# Patient Record
Sex: Female | Born: 1935 | Race: White | Hispanic: No | Marital: Single | Smoking: Never smoker
Health system: Southern US, Community
[De-identification: ages and names within clinical notes are randomized; demographics above are authoritative.]

## PROBLEM LIST (undated history)

## (undated) DIAGNOSIS — I639 Cerebral infarction, unspecified: Secondary | ICD-10-CM

## (undated) DIAGNOSIS — F32A Depression, unspecified: Secondary | ICD-10-CM

## (undated) DIAGNOSIS — I10 Essential (primary) hypertension: Secondary | ICD-10-CM

## (undated) DIAGNOSIS — F329 Major depressive disorder, single episode, unspecified: Secondary | ICD-10-CM

## (undated) DIAGNOSIS — I4891 Unspecified atrial fibrillation: Secondary | ICD-10-CM

## (undated) HISTORY — DX: Unspecified atrial fibrillation: I48.91

## (undated) HISTORY — PX: CHOLECYSTECTOMY: SHX55

## (undated) HISTORY — PX: JOINT REPLACEMENT: SHX530

## (undated) HISTORY — PX: BACK SURGERY: SHX140

---

## 2013-03-14 ENCOUNTER — Ambulatory Visit: Payer: Medicare Other | Attending: Orthopaedic Surgery | Admitting: Occupational Therapy

## 2013-03-14 DIAGNOSIS — M25649 Stiffness of unspecified hand, not elsewhere classified: Secondary | ICD-10-CM | POA: Insufficient documentation

## 2013-03-14 DIAGNOSIS — IMO0001 Reserved for inherently not codable concepts without codable children: Secondary | ICD-10-CM | POA: Insufficient documentation

## 2013-03-14 DIAGNOSIS — M6281 Muscle weakness (generalized): Secondary | ICD-10-CM | POA: Insufficient documentation

## 2013-03-15 ENCOUNTER — Ambulatory Visit: Payer: Medicare Other | Admitting: Occupational Therapy

## 2013-03-21 ENCOUNTER — Ambulatory Visit: Payer: Medicare Other | Admitting: Occupational Therapy

## 2013-03-30 ENCOUNTER — Ambulatory Visit: Payer: Medicare Other | Attending: Orthopaedic Surgery | Admitting: Occupational Therapy

## 2013-03-30 DIAGNOSIS — M25649 Stiffness of unspecified hand, not elsewhere classified: Secondary | ICD-10-CM | POA: Insufficient documentation

## 2013-03-30 DIAGNOSIS — IMO0001 Reserved for inherently not codable concepts without codable children: Secondary | ICD-10-CM | POA: Insufficient documentation

## 2013-03-30 DIAGNOSIS — M6281 Muscle weakness (generalized): Secondary | ICD-10-CM | POA: Insufficient documentation

## 2013-04-01 ENCOUNTER — Ambulatory Visit: Payer: Medicare Other | Admitting: Occupational Therapy

## 2013-04-06 ENCOUNTER — Ambulatory Visit: Payer: Medicare Other | Admitting: Occupational Therapy

## 2013-04-08 ENCOUNTER — Ambulatory Visit: Payer: Medicare Other | Admitting: Occupational Therapy

## 2013-04-13 ENCOUNTER — Ambulatory Visit: Payer: Medicare Other | Admitting: Occupational Therapy

## 2013-04-15 ENCOUNTER — Ambulatory Visit: Payer: Medicare Other | Admitting: Occupational Therapy

## 2013-04-18 ENCOUNTER — Ambulatory Visit: Payer: Medicare Other | Admitting: Occupational Therapy

## 2013-04-21 ENCOUNTER — Ambulatory Visit: Payer: Medicare Other | Admitting: Physical Therapy

## 2013-04-21 ENCOUNTER — Ambulatory Visit: Payer: Medicare Other | Admitting: Occupational Therapy

## 2013-04-25 ENCOUNTER — Ambulatory Visit: Payer: Medicare Other | Admitting: Rehabilitative and Restorative Service Providers"

## 2013-04-25 ENCOUNTER — Ambulatory Visit: Payer: Medicare Other | Attending: Orthopaedic Surgery | Admitting: Occupational Therapy

## 2013-04-25 DIAGNOSIS — M6281 Muscle weakness (generalized): Secondary | ICD-10-CM | POA: Insufficient documentation

## 2013-04-25 DIAGNOSIS — IMO0001 Reserved for inherently not codable concepts without codable children: Secondary | ICD-10-CM | POA: Insufficient documentation

## 2013-04-25 DIAGNOSIS — M25649 Stiffness of unspecified hand, not elsewhere classified: Secondary | ICD-10-CM | POA: Insufficient documentation

## 2013-04-27 ENCOUNTER — Ambulatory Visit: Payer: Medicare Other | Admitting: Occupational Therapy

## 2013-04-27 ENCOUNTER — Ambulatory Visit: Payer: Medicare Other | Admitting: Physical Therapy

## 2013-05-02 ENCOUNTER — Encounter: Payer: Medicare Other | Admitting: Occupational Therapy

## 2013-05-02 ENCOUNTER — Ambulatory Visit: Payer: Medicare Other | Admitting: Physical Therapy

## 2013-05-04 ENCOUNTER — Encounter: Payer: Medicare Other | Admitting: Occupational Therapy

## 2013-05-04 ENCOUNTER — Ambulatory Visit: Payer: Medicare Other | Admitting: Physical Therapy

## 2013-05-10 ENCOUNTER — Encounter: Payer: Medicare Other | Admitting: Occupational Therapy

## 2013-05-10 ENCOUNTER — Ambulatory Visit: Payer: Medicare Other | Admitting: Physical Therapy

## 2013-05-12 ENCOUNTER — Encounter: Payer: Medicare Other | Admitting: Occupational Therapy

## 2013-05-12 ENCOUNTER — Ambulatory Visit: Payer: Medicare Other | Admitting: Physical Therapy

## 2013-05-13 ENCOUNTER — Ambulatory Visit: Payer: Medicare Other | Admitting: Physical Therapy

## 2013-05-13 ENCOUNTER — Encounter: Payer: Medicare Other | Admitting: Occupational Therapy

## 2013-05-17 ENCOUNTER — Ambulatory Visit: Payer: Medicare Other | Admitting: Rehabilitative and Restorative Service Providers"

## 2013-05-19 ENCOUNTER — Ambulatory Visit: Payer: Medicare Other | Admitting: Physical Therapy

## 2013-05-20 ENCOUNTER — Ambulatory Visit: Payer: Medicare Other | Admitting: Physical Therapy

## 2016-02-22 MED FILL — TEMAZEPAM 15 MG CAPSULE: 15 | 30 days supply | Qty: 30 | Fill #0

## 2016-03-06 ENCOUNTER — Encounter: Payer: Self-pay | Admitting: Emergency Medicine

## 2016-03-06 ENCOUNTER — Emergency Department (INDEPENDENT_AMBULATORY_CARE_PROVIDER_SITE_OTHER)
Admission: EM | Admit: 2016-03-06 | Discharge: 2016-03-06 | Disposition: A | Discharge status: Home / Self Care | Payer: Medicare Other | Source: Home / Self Care | Attending: Emergency Medicine | Admitting: Emergency Medicine

## 2016-03-06 ENCOUNTER — Emergency Department (INDEPENDENT_AMBULATORY_CARE_PROVIDER_SITE_OTHER): Payer: Medicare Other

## 2016-03-06 DIAGNOSIS — J209 Acute bronchitis, unspecified: Secondary | ICD-10-CM

## 2016-03-06 DIAGNOSIS — R062 Wheezing: Secondary | ICD-10-CM

## 2016-03-06 DIAGNOSIS — R0989 Other specified symptoms and signs involving the circulatory and respiratory systems: Secondary | ICD-10-CM | POA: Diagnosis not present

## 2016-03-06 DIAGNOSIS — R059 Cough, unspecified: Secondary | ICD-10-CM

## 2016-03-06 DIAGNOSIS — R05 Cough: Secondary | ICD-10-CM

## 2016-03-06 DIAGNOSIS — J0100 Acute maxillary sinusitis, unspecified: Secondary | ICD-10-CM

## 2016-03-06 HISTORY — DX: Cerebral infarction, unspecified: I63.9

## 2016-03-06 HISTORY — DX: Major depressive disorder, single episode, unspecified: F32.9

## 2016-03-06 HISTORY — DX: Essential (primary) hypertension: I10

## 2016-03-06 HISTORY — DX: Depression, unspecified: F32.A

## 2016-03-06 MED ORDER — IPRATROPIUM-ALBUTEROL 0.5-2.5 (3) MG/3ML IN SOLN
1.5000 mL | RESPIRATORY_TRACT | Status: AC
  Administered 2016-03-06: 1.5 mL via RESPIRATORY_TRACT

## 2016-03-06 MED ORDER — BUDESONIDE-FORMOTEROL FUMARATE 80-4.5 MCG/ACT IN AERO: 2.0000 | INHALATION_SPRAY | Freq: Two times a day (BID) | RESPIRATORY_TRACT | 0 refills | Status: DC

## 2016-03-06 MED ORDER — FLUTICASONE PROPIONATE 50 MCG/ACT NA SUSP: NASAL | 0 refills | Status: DC

## 2016-03-06 MED ORDER — CEFDINIR 300 MG PO CAPS: 300.0000 mg | ORAL_CAPSULE | Freq: Two times a day (BID) | ORAL | 0 refills | Status: DC

## 2016-03-06 MED FILL — CEFDINIR 300 MG CAPSULE: 300 | 10 days supply | Qty: 20 | Fill #0

## 2016-03-06 MED FILL — FLUTICASONE PROP 50 MCG SPR: 50 | 30 days supply | Qty: 16 | Fill #0

## 2016-03-06 MED FILL — SYMBICORT 80-4.5 MCG INH: 80-4.5 | 30 days supply | Qty: 10 | Fill #0

## 2016-03-06 NOTE — ED Provider Notes (Signed)
[facility]    CSN: [id number] Arrival date & time: 03/06/16  1025     History   Chief Complaint Chief Complaint  Patient presents with  . Cough    HPI Megan Livingston is a 82 y.o. female.   The history is provided by the patient and a relative (Here with son-in-law.).  Pt is from [place], visiting and staying with daughter and son-in-law.  Was in usual state of health until onset of URI sxs 1 wk ago. Started with sinus congestion, discolored rhinorrhea, facial pain , progressed to bilat pressure in ears, hoarseness, sore throat. Then, over past 3 days, sxs progressed to frequent nonproductive cough, chest congestion, occ mild shortness of breath. Rest and otc tx is not helping. Denies increase in baseline mild pedal edema bilaterally. She denies hx or dx of asthma or COPD, but son-in-law states that in the past, had similar sxs, treated effectively with an abx and Symbicort. Denies hx of CHF.  No chills/sweats Possible Low-grade Fever  +  Nasal congestion +  Discolored Post-nasal drainage + sinus pain/pressure + sore throat  +  cough ? wheezing + chest congestion No hemoptysis No significant shortness of breath currently. Denies orthopnea. No pleuritic pain. No exertional chest pain.  No itchy/red eyes + bilat earache  No nausea No vomiting No abdominal pain No diarrhea  No skin rashes +  Fatigue No myalgias No headache Denies syncope. Denies any recent falls. Denies any new focal neuro symptoms.  Denies any GU sxs.  Past Medical History:  Diagnosis Date  . Depression   . Hypertension   . Stroke [facility](HCC)   History of "minor stroke" four years ago, hypertension, depression,Hypercholesterolemia. PCP is Dr. Debbora PrestoFlippo in [place]. Denies PMH of pneumonia, COPD, asthma, CHF. Her baseline ambulation is using a walker with assistance.   Past Surgical History:  Procedure Laterality Date  . BACK SURGERY    . CHOLECYSTECTOMY    . JOINT  REPLACEMENT    History of knee replacement 12 years ago, gallbladder surgery 10 years ago, back surgery four years ago.  OB History    No data available     Family history:Positive for heart disease, CHF, CVA, depression  Home Medications    Prior to Admission medications   Medication Sig Start Date End Date Taking? Authorizing Provider  aspirin 81 MG chewable tablet Chew by mouth daily.   Yes Historical Provider, MD  DULoxetine (CYMBALTA) 20 MG capsule Take 20 mg by mouth daily.   Yes Historical Provider, MD  losartan (COZAAR) 100 MG tablet Take 100 mg by mouth daily.   Yes Historical Provider, MD  metoprolol (LOPRESSOR) 100 MG tablet Take 100 mg by mouth 2 (two) times daily.   Yes Historical Provider, MD  omeprazole (PRILOSEC) 10 MG capsule Take 10 mg by mouth daily.   Yes Historical Provider, MD  simvastatin (ZOCOR) 10 MG tablet Take 10 mg by mouth daily.   Yes Historical Provider, MD  temazepam (RESTORIL) 15 MG capsule Take 15 mg by mouth at bedtime as needed for sleep.   Yes Historical Provider, MD  budesonide-formoterol (SYMBICORT) 80-4.5 MCG/ACT inhaler Inhale 2 puffs into the lungs 2 (two) times daily. 03/06/16   Lajean Manesavid Massey, MD  cefdinir (OMNICEF) 300 MG capsule Take 1 capsule (300 mg total) by mouth 2 (two) times daily. X 10 days 03/06/16   Lajean Manesavid Massey, MD  fluticasone [facility](FLONASE) 50 MCG/ACT nasal spray 1 or 2 sprays each nostril twice a day 03/06/16  Lajean Manesavid Massey, MD    Family History No family history on file.  Social History Social History  Substance Use Topics  . Smoking status: Never Smoker  . Smokeless tobacco: Never Used  . Alcohol use No     Allergies   Patient has no known allergies.   Review of Systems Review of Systems  All other systems reviewed and are negative.    Physical Exam Triage Vital Signs ED Triage Vitals  Enc Vitals Group     BP 03/06/16 1052 137/91     Pulse Rate 03/06/16 1052 95     Resp --      Temp 03/06/16 1052 98.4 F (36.9  C)     Temp Source 03/06/16 1052 Oral     SpO2 03/06/16 1052 94 %     Weight 03/06/16 1053 220 lb (99.8 kg)     Height 03/06/16 1053 5\' 3"  (1.6 m)     Head Circumference --      Peak Flow --      Pain Score 03/06/16 1058 4     Pain Loc --      Pain Edu? --      Excl. in GC? --    No data found.   Updated Vital Signs BP 137/91 (BP Location: Left Arm)   Pulse 93   Temp 98.4 F (36.9 C) (Oral)   Ht 5\' 3"  (1.6 m)   Wt 220 lb (99.8 kg)   SpO2 95%   BMI 38.97 kg/m    Physical Exam  Constitutional: She is oriented to person, place, and time. She appears well-developed and well-nourished. No distress.  Pleasant female. Cooperative. Uses walker to ambulate.  HENT:  Head: Normocephalic and atraumatic.  Right Ear: Tympanic membrane, external ear and ear canal normal.  Left Ear: Tympanic membrane, external ear and ear canal normal.  Nose: Mucosal edema and rhinorrhea present. Right sinus exhibits maxillary sinus tenderness. Left sinus exhibits maxillary sinus tenderness.  Mouth/Throat: No oral lesions.  Oral mucous membranes moist. Post pharynx minimal injection. Mild yellow seromucoid exudate in posterior pharynx. No tonsilar enlargement. No pharyngeal edema. Airway intact. Voice is hoarse.  Eyes: Right eye exhibits no discharge. Left eye exhibits no discharge. No scleral icterus.  Neck: Neck supple. No JVD present. No tracheal deviation present.  Cardiovascular: Normal rate, regular rhythm and normal heart sounds.   Pulmonary/Chest: Effort normal. No stridor. She has wheezes. She has rhonchi. She has no rales.  Diffuse harsh rhonchi in all lung fields, posterior and anterior bilaterally. + mild late expiratory wheezes bilat. Fairly good air movement bilat. Breath sounds equal bilat.  Musculoskeletal: She exhibits edema (trace pretibial edema bilat). She exhibits no tenderness (No calf ttp or cords.).  Lymphadenopathy:    She has no cervical adenopathy.  Neurological: She is  alert and oriented to person, place, and time.  Skin: Skin is warm and dry. Capillary refill takes less than 2 seconds. No rash noted.  Psychiatric: She has a normal mood and affect.  Nursing note and vitals reviewed.    UC Treatments / Results  Labs (all labs ordered are listed, but only abnormal results are displayed) Labs Reviewed - No data to display  EKG  EKG Interpretation None       Radiology Dg Chest 2 View  Result Date: 03/06/2016 CLINICAL DATA:  Cough and congestion for 1 week EXAM: CHEST  2 VIEW COMPARISON:  None. FINDINGS: Cardiac shadow is within normal limits. Hiatal hernia is noted. Lungs are  well aerated bilaterally. Mild interstitial changes are seen without focal infiltrate. No effusion is noted. No bony abnormality seen. IMPRESSION: Mild interstitial changes which may be chronic in nature. Electronically Signed   By: Alcide Clever M.D.   On: 03/06/2016 11:54    Procedures Procedures (including critical care time)  Medications Ordered in UC Medications  ipratropium-albuterol (DUONEB) 0.5-2.5 (3) MG/3ML nebulizer solution 1.5 mL (1.5 mLs Nebulization Given 03/06/16 1137)  ipratropium-albuterol (DUONEB) 0.5-2.5 (3) MG/3ML nebulizer solution 1.5 mL (1.5 mLs Nebulization Given 03/06/16 1158)     Initial Impression / Assessment and Plan / UC Course  I have reviewed the triage vital signs and the nursing notes.  Pertinent labs & imaging results that were available during my care of the patient were reviewed by me and considered in my medical decision making (see chart for details).  Clinical Course as of Mar 06 2337  Thu Mar 06, 2016  1118 Here with son-in-law.History and physical exam performed. Ordered a DuoNeb and chest x-ray  [DM]  1143 After first DuoNeb 1.5 ML, patient tolerated well without side effects. Vital signs remain stable. Wheezing improved somewhat. Pulse ox rechecked 94% room air. We will give another 1.5 ML DuoNeb treatment in attempt to  improve wheezing further.  [DM]    Clinical Course User Index [DM] Lajean Manes, MD  note: Because of age, ordered lower first dose of 1.5 ML's DuoNeb as a precautionary measure. She tolerated this well. - see above. Then, ordered another Duoneb 1.5 ml.-She tolerated this well and she felt breathing improved. Lungs reck'd: Much improved aeration bilat. Much less wheezing. Pulse ox, RA improved to 95%. P 84 by me, RR 16.   Reviewed CXR: No infiltrates. NAD. No definite CHF.  Final Clinical Impressions(s) / UC Diagnoses   Final diagnoses:  Wheezing  Cough  Chest congestion  Acute bronchitis with bronchospasm  Acute maxillary sinusitis, recurrence not specified   Treatment options discussed, as well as risks, benefits, alternatives. They declined oral steroid burst or shot of DepoMedrol. They declined shot of Rocephin. Patient and son-in-law voiced understanding and agreement with the following plans:   New Prescriptions Discharge Medication List as of 03/06/2016 12:34 PM    START taking these medications   Details  budesonide-formoterol (SYMBICORT) 80-4.5 MCG/ACT inhaler Inhale 2 puffs into the lungs 2 (two) times daily., Starting Thu 03/06/2016, Normal    cefdinir (OMNICEF) 300 MG capsule Take 1 capsule (300 mg total) by mouth 2 (two) times daily. X 10 days, Starting Thu 03/06/2016, Normal    fluticasone (FLONASE) 50 MCG/ACT nasal spray 1 or 2 sprays each nostril twice a day, Normal      I'm choosing Omnicef for bacteria coverage of sinus and bronchitis pathogens. Follow-up with your doctor in [place] or urgent care ([facility]) if not improving in 4-5 days, or ER if worse or any red flags. Precautions discussed. Other otc sxs tx discussed. Red flags discussed. An After Visit Summary was printed and given to the patient. Questions invited and answered. They voiced understanding and agreement.    Lajean Manes, MD 03/06/16 [phone]

## 2016-03-06 NOTE — Discharge Instructions (Signed)
Chest x-ray today shows no pneumonia. Clinically, no evidence of congestive heart failure. Diagnosis is sinus infection and bronchitis with wheezing. Here in urgent care, we treated with DuoNeb nebulizer, which helped, and improved oxygen saturations to 95% May use Robitussin-DM as needed for cough. Tylenol as needed for headache. Drink plenty of fluids. Follow-up with your physician in [place]. If any worsening symptoms or if no better in one week, follow-up at urgent care or emergency department if needed.

## 2016-03-06 NOTE — ED Triage Notes (Signed)
Cough, headache, ears hurt, sore throat, hoarseness x 1 week, worse last 3 days, no fever

## 2016-03-18 ENCOUNTER — Emergency Department (INDEPENDENT_AMBULATORY_CARE_PROVIDER_SITE_OTHER): Payer: Medicare Other

## 2016-03-18 ENCOUNTER — Emergency Department (INDEPENDENT_AMBULATORY_CARE_PROVIDER_SITE_OTHER)
Admission: EM | Admit: 2016-03-18 | Discharge: 2016-03-18 | Disposition: A | Discharge status: Home / Self Care | Payer: Medicare Other | Source: Home / Self Care | Attending: Family Medicine | Admitting: Family Medicine

## 2016-03-18 ENCOUNTER — Encounter: Payer: Self-pay | Admitting: *Deleted

## 2016-03-18 DIAGNOSIS — J9801 Acute bronchospasm: Secondary | ICD-10-CM

## 2016-03-18 DIAGNOSIS — J069 Acute upper respiratory infection, unspecified: Secondary | ICD-10-CM

## 2016-03-18 DIAGNOSIS — J9811 Atelectasis: Secondary | ICD-10-CM

## 2016-03-18 DIAGNOSIS — R062 Wheezing: Secondary | ICD-10-CM

## 2016-03-18 MED ORDER — IPRATROPIUM-ALBUTEROL 0.5-2.5 (3) MG/3ML IN SOLN
3.0000 mL | Freq: Once | RESPIRATORY_TRACT | Status: AC
  Administered 2016-03-18: 3 mL via RESPIRATORY_TRACT

## 2016-03-18 MED ORDER — AEROCHAMBER PLUS W/MASK MISC: 2 refills | Status: DC

## 2016-03-18 MED ORDER — ALBUTEROL SULFATE HFA 108 (90 BASE) MCG/ACT IN AERS: 1.0000 | INHALATION_SPRAY | Freq: Four times a day (QID) | RESPIRATORY_TRACT | 0 refills | Status: DC | PRN

## 2016-03-18 MED ORDER — AZITHROMYCIN 250 MG PO TABS: 250.0000 mg | ORAL_TABLET | Freq: Every day | ORAL | 0 refills | Status: DC

## 2016-03-18 MED ORDER — METHYLPREDNISOLONE SODIUM SUCC 40 MG IJ SOLR
80.0000 mg | Freq: Once | INTRAMUSCULAR | Status: AC
  Administered 2016-03-18: 80 mg via INTRAMUSCULAR

## 2016-03-18 MED ORDER — PREDNISONE 20 MG PO TABS: ORAL_TABLET | ORAL | 0 refills | Status: DC

## 2016-03-18 MED FILL — predniSONE 20 MG TABS: 20 | 5 days supply | Qty: 11 | Fill #0

## 2016-03-18 MED FILL — AZITHROMYCIN 250 MG TABLET: 250 | 5 days supply | Qty: 6 | Fill #0

## 2016-03-18 MED FILL — PROAIR HFA 90 MCG INHALER: 108 (90 BAS | 25 days supply | Qty: 9 | Fill #0

## 2016-03-18 MED FILL — MICROCHAMBER: 1 days supply | Qty: 1 | Fill #0

## 2016-03-18 NOTE — ED Provider Notes (Signed)
CSN: [id number]     Arrival date & time 03/18/16  1232 History   First MD Initiated Contact with Patient 03/18/16 1316     Chief Complaint  Patient presents with  . Cough  . Shortness of Breath   (Consider location/radiation/quality/duration/timing/severity/associated sxs/prior Treatment) HPI Megan Livingston is a 82 y.o. female presenting to UC with son-in-law c/o continued moderately productive cough, shortness of breath and fatigue despite completion of Omnicef last week.  [facility] for worsening URI symptoms.  Pt declined steroid treatment at that time. Denies hx of CHF, asthma or COPD.  Pt does use a walker for assistance ambulating but not for breathing and she is not on home O2. Pt is visiting from out of town. Denies fever, n/v/d.    Past Medical History:  Diagnosis Date  . Depression   . Hypertension   . Stroke [facility](HCC)    Past Surgical History:  Procedure Laterality Date  . BACK SURGERY    . CHOLECYSTECTOMY    . JOINT REPLACEMENT     History reviewed. No pertinent family history. Social History  Substance Use Topics  . Smoking status: Never Smoker  . Smokeless tobacco: Never Used  . Alcohol use No   OB History    No data available     Review of Systems  Constitutional: Positive for fatigue. Negative for chills and fever.  HENT: Positive for congestion. Negative for ear pain, sore throat, trouble swallowing and voice change.   Respiratory: Positive for cough, chest tightness, shortness of breath and wheezing.   Cardiovascular: Negative for chest pain and palpitations.  Gastrointestinal: Negative for abdominal pain, diarrhea, nausea and vomiting.  Musculoskeletal: Negative for arthralgias, back pain and myalgias.  Skin: Negative for rash.  Neurological: Positive for weakness ( generalized). Negative for dizziness, light-headedness and headaches.  All other systems reviewed and are negative.   Allergies  Patient has no known  allergies.  Home Medications   Prior to Admission medications   Medication Sig Start Date End Date Taking? Authorizing Provider  albuterol (PROVENTIL HFA;VENTOLIN HFA) 108 (90 Base) MCG/ACT inhaler Inhale 1-2 puffs into the lungs every 6 (six) hours as needed for wheezing or shortness of breath. 03/18/16   Junius FinnerErin O'Malley, PA-C  aspirin 81 MG chewable tablet Chew by mouth daily.    Historical Provider, MD  azithromycin (ZITHROMAX) 250 MG tablet Take 1 tablet (250 mg total) by mouth daily. Take first 2 tablets together, then 1 every day until finished. 03/18/16   Junius FinnerErin O'Malley, PA-C  budesonide-formoterol (SYMBICORT) 80-4.5 MCG/ACT inhaler Inhale 2 puffs into the lungs 2 (two) times daily. 03/06/16   Lajean Manesavid Massey, MD  DULoxetine (CYMBALTA) 20 MG capsule Take 20 mg by mouth daily.    Historical Provider, MD  fluticasone Aleda Grana(FLONASE) 50 MCG/ACT nasal spray 1 or 2 sprays each nostril twice a day 03/06/16   Lajean Manesavid Massey, MD  losartan (COZAAR) 100 MG tablet Take 100 mg by mouth daily.    Historical Provider, MD  metoprolol (LOPRESSOR) 100 MG tablet Take 100 mg by mouth 2 (two) times daily.    Historical Provider, MD  omeprazole (PRILOSEC) 10 MG capsule Take 10 mg by mouth daily.    Historical Provider, MD  predniSONE (DELTASONE) 20 MG tablet 3 tabs po day one, then 2 po daily x 4 days 03/18/16   Junius FinnerErin O'Malley, PA-C  simvastatin (ZOCOR) 10 MG tablet Take 10 mg by mouth daily.    Historical Provider, MD  Spacer/Aero-Holding Deretha Emoryhambers (  AEROCHAMBER PLUS WITH MASK) inhaler Use as instructed 03/18/16   Junius FinnerErin O'Malley, PA-C  temazepam (RESTORIL) 15 MG capsule Take 15 mg by mouth at bedtime as needed for sleep.    Historical Provider, MD   Meds Ordered and Administered this Visit   Medications  ipratropium-albuterol (DUONEB) 0.5-2.5 (3) MG/3ML nebulizer solution 3 mL (3 mLs Nebulization Given 03/18/16 1328)  methylPREDNISolone sodium succinate (SOLU-MEDROL) 40 mg/mL injection 80 mg (80 mg Intramuscular Given  03/18/16 1429)  ipratropium-albuterol (DUONEB) 0.5-2.5 (3) MG/3ML nebulizer solution 3 mL (3 mLs Nebulization Given 03/18/16 1444)    BP 161/78 (BP Location: Left Arm)   Pulse 88   Temp 97.9 F (36.6 C) (Oral)   Resp 20   SpO2 95% Comment: post duo neb No data found.   Physical Exam  Constitutional: She appears well-developed and well-nourished. No distress.  Pt sitting in exam chair, NAD.  HENT:  Head: Normocephalic and atraumatic.  Right Ear: Tympanic membrane normal.  Left Ear: Tympanic membrane normal.  Nose: Nose normal.  Mouth/Throat: Uvula is midline, oropharynx is clear and moist and mucous membranes are normal.  Eyes: Conjunctivae are normal. No scleral icterus.  Neck: Normal range of motion. Neck supple.  Cardiovascular: Normal rate, regular rhythm and normal heart sounds.   Pulmonary/Chest: Effort normal. No respiratory distress. She has wheezes. She has rhonchi. She has rales. She exhibits no tenderness.  Diffuse wheeze and coarse breath sounds with intermittent productive cough. No respiratory distress.  Musculoskeletal: Normal range of motion.  Neurological: She is alert.  Skin: Skin is warm and dry. She is not diaphoretic.  Nursing note and vitals reviewed.   Urgent Care Course   Clinical Course     Procedures (including critical care time)  Labs Review Labs Reviewed - No data to display  Imaging Review Dg Chest 2 View  Addendum Date: 03/18/2016   ADDENDUM REPORT: 03/18/2016 15:27 ADDENDUM: Comparison made to prior chest x-ray 03/06/2016. Mild basilar interstitial prominence and right base subsegmental atelectasis and/or scarring unchanged from prior exam. Electronically Signed   By: Maisie Fushomas  Register   On: 03/18/2016 15:27   Result Date: 03/18/2016 CLINICAL DATA:  Cough and congestion . EXAM: CHEST  2 VIEW COMPARISON:  No recent prior . FINDINGS: Mediastinum and hilar structures normal. Heart size normal. Mild right base subsegmental atelectasis and or  infiltrate. No pleural effusion or pneumothorax. Sliding hiatal hernia noted. IMPRESSION: Mild right base subsegmental atelectasis. Sliding hiatal hernia noted. Electronically Signed: ByMaisie Fus: Thomas  Register On: 03/18/2016 14:06    MDM   1. Upper respiratory tract infection, unspecified type   2. Wheeze   3. Acute bronchospasm    Pt c/o persistent cough with fatigue. O2 Sat 93% on RA Coarse breath sounds throughout. CXR: unchanged from prior CXR on 03/06/16.  Pt given 2 duobnebs in UC with solumedrol 80mg  IM  Slight improvement of lung sounds. O2 Sat up to 95% on RA Pt feels comfortable being discharged home. Rx: Prednisone, albuterol inhaler with spacer and azithromycin for atypical bacteria coverage. F/u in 2 days for recheck of symptoms.     Junius Finnerrin O'Malley, PA-C 03/18/16 [phone]

## 2016-03-18 NOTE — Discharge Instructions (Signed)
°  You were given a shot of solumedrol (a steroid) today to help decrease inflammation in your airway to help with cough and wheeze.  You have been prescribed 5 days of prednisone, an oral steroid.  You may start this medication tomorrow with breakfast.

## 2016-03-18 NOTE — ED Triage Notes (Signed)
Patient was seen 03/06/2016, given Omnicef which she has completed. She has since developed worsening cough, with SOB and fatigue. Afebrile.

## 2016-03-20 ENCOUNTER — Encounter: Payer: Self-pay | Admitting: *Deleted

## 2016-03-20 ENCOUNTER — Emergency Department (INDEPENDENT_AMBULATORY_CARE_PROVIDER_SITE_OTHER)
Admission: EM | Admit: 2016-03-20 | Discharge: 2016-03-20 | Disposition: A | Discharge status: Home / Self Care | Payer: Medicare Other | Source: Home / Self Care | Attending: Emergency Medicine | Admitting: Emergency Medicine

## 2016-03-20 DIAGNOSIS — J209 Acute bronchitis, unspecified: Secondary | ICD-10-CM

## 2016-03-20 NOTE — ED Provider Notes (Signed)
[facility]    CSN: [id number] Arrival date & time: 03/20/16  1038     History   Chief Complaint Chief Complaint  Patient presents with  . Cough  . Follow-up    HPI Megan Livingston is a 82 y.o. female.   HPI Patient's son-in-law brings her in to urgent care for follow-up from the most recent visit here 03/18/16.  Both patient and son-in-law provide history. She is taking the Zithromax and prednisone as recently prescribed here 03/18/16. She is tolerating this well. Overall, she feels definite and significant improvement over the past 2 days. Much less shortness of breath and wheezing. Energy and mood is gradually improving. Her cough is now loose and wet and occasionally productive of yellow sputum. She feels when she coughs up sputum, she feels even better. Appetite is improved. She notes that she is more active and has been able to enjoy her family activities much more the past 2 days( with her daughter and son-in-law) .  Further past medical history updated verbally from son-in-law: Has had negative cardiac workup in the past including normal EKG and echocardiogram within the past year or so. Past Medical History:  Diagnosis Date  . Depression   . Hypertension   . Stroke [facility](HCC)     There are no active problems to display for this patient.   Past Surgical History:  Procedure Laterality Date  . BACK SURGERY    . CHOLECYSTECTOMY    . JOINT REPLACEMENT      OB History    No data available       Home Medications    Prior to Admission medications   Medication Sig Start Date End Date Taking? Authorizing Provider  albuterol (PROVENTIL HFA;VENTOLIN HFA) 108 (90 Base) MCG/ACT inhaler Inhale 1-2 puffs into the lungs every 6 (six) hours as needed for wheezing or shortness of breath. 03/18/16   Junius FinnerErin O'Malley, PA-C  aspirin 81 MG chewable tablet Chew by mouth daily.    Historical Provider, MD  azithromycin (ZITHROMAX) 250 MG tablet Take 1 tablet (250 mg  total) by mouth daily. Take first 2 tablets together, then 1 every day until finished. 03/18/16   Junius FinnerErin O'Malley, PA-C  budesonide-formoterol (SYMBICORT) 80-4.5 MCG/ACT inhaler Inhale 2 puffs into the lungs 2 (two) times daily. 03/06/16   Lajean Manesavid Massey, MD  DULoxetine (CYMBALTA) 20 MG capsule Take 20 mg by mouth daily.    Historical Provider, MD  fluticasone Aleda Grana(FLONASE) 50 MCG/ACT nasal spray 1 or 2 sprays each nostril twice a day 03/06/16   Lajean Manesavid Massey, MD  losartan (COZAAR) 100 MG tablet Take 100 mg by mouth daily.    Historical Provider, MD  metoprolol (LOPRESSOR) 100 MG tablet Take 100 mg by mouth 2 (two) times daily.    Historical Provider, MD  omeprazole (PRILOSEC) 10 MG capsule Take 10 mg by mouth daily.    Historical Provider, MD  predniSONE (DELTASONE) 20 MG tablet 3 tabs po day one, then 2 po daily x 4 days 03/18/16   Junius FinnerErin O'Malley, PA-C  simvastatin (ZOCOR) 10 MG tablet Take 10 mg by mouth daily.    Historical Provider, MD  Spacer/Aero-Holding Chambers (AEROCHAMBER PLUS WITH MASK) inhaler Use as instructed 03/18/16   Junius FinnerErin O'Malley, PA-C  temazepam (RESTORIL) 15 MG capsule Take 15 mg by mouth at bedtime as needed for sleep.    Historical Provider, MD    Family History History reviewed. No pertinent family history.  Social History Social History  Substance Use Topics  .  Smoking status: Never Smoker  . Smokeless tobacco: Never Used  . Alcohol use No     Allergies   Patient has no known allergies.   Review of Systems Review of Systems  All other systems reviewed and are negative.    Physical Exam Triage Vital Signs ED Triage Vitals  Enc Vitals Group     BP 03/20/16 1116 161/71     Pulse Rate 03/20/16 1116 93     Resp 03/20/16 1116 18     Temp 03/20/16 1116 98 F (36.7 C)     Temp Source 03/20/16 1116 Oral     SpO2 03/20/16 1116 91 %     Weight 03/20/16 1116 218 lb (98.9 kg)     Height --      Head Circumference --      Peak Flow --      Pain Score 03/20/16 1117 0      Pain Loc --      Pain Edu? --      Excl. in GC? --    No data found.   Updated Vital Signs BP 161/71 (BP Location: Left Arm)   Pulse 93   Temp 98 F (36.7 C) (Oral)   Resp 18   Wt 218 lb (98.9 kg)   SpO2 91%   BMI 38.62 kg/m    Physical Exam  Constitutional: She is oriented to person, place, and time. She appears well-developed and well-nourished. No distress.  Overall, she looks happier and more comfortable than last time I saw her. Smiling a lot more. Breathing comfortably.  HENT:  Head: Normocephalic and atraumatic.  Right Ear: Tympanic membrane normal.  Left Ear: Tympanic membrane normal.  Nose: Nose normal.  Mouth/Throat: Oropharynx is clear and moist. No oropharyngeal exudate.  Eyes: Right eye exhibits no discharge. Left eye exhibits no discharge. No scleral icterus.  Neck: Neck supple.  Cardiovascular: Normal rate, regular rhythm and normal heart sounds.   Pulmonary/Chest: No respiratory distress. She has wheezes (Rare scattered late expiratory wheezes, but otherwise lungs clear, good air movement.). She has no rhonchi. She has no rales.  Lungs with rare late expiratory wheezes but otherwise clear. No rhonchi or rales. Breath sounds equal.  Lymphadenopathy:    She has no cervical adenopathy.  Neurological: She is alert and oriented to person, place, and time.  Skin: Skin is warm and dry. No rash noted. She is not diaphoretic.  Nursing note and vitals reviewed.  Repeated respiratory rate 16. Repeated pulse ox 94% on room air.  UC Treatments / Results  Labs (all labs ordered are listed, but only abnormal results are displayed) Labs Reviewed - No data to display  EKG  EKG Interpretation None       Radiology No results found.  Procedures Procedures (including critical care time)  Medications Ordered in UC Medications - No data to display   Initial Impression / Assessment and Plan / UC Course  I have reviewed the triage vital signs and the  nursing notes.  Pertinent labs & imaging results that were available during my care of the patient were reviewed by me and considered in my medical decision making (see chart for details).  Clinical Course      Final Clinical Impressions(s) / UC Diagnoses   Final diagnoses:  Acute bronchitis, unspecified organism  Clinically, her acute bronchitis with bronchospasm is significantly improving. Discussed options at length with patient and son-in-law. They declined any IM treatment, such as the option of Rocephin or another  Depo-Medrol shot. As she is significantly improving, will continue her current new medications azithromycin, prednisone burst.  An After Visit Summary was printed and given to the patient and son-in-law. Son-in-law states that family is driving to [place] to see other family, en route to driving the patient back to her home in [place]. Follow-up with your primary care doctor in 3-5 days. Precautions discussed. Red flags discussed.-Emergency room if any red flag Questions invited and answered. They voiced understanding and agreement.     Lajean Manesavid Massey, MD 03/20/16 [phone]

## 2016-03-20 NOTE — Discharge Instructions (Signed)
You are gradually improving. Lungs sound better today. Continue medicines as previously prescribed, particularly azithromycin and prednisone, as well as albuterol inhaler if needed for wheezing. Follow-up with your personal physician in 3-5 days. If any severe or worsening symptoms, go to emergency room.

## 2016-03-20 NOTE — ED Triage Notes (Signed)
Patient is here for a f/u from visit on 03/18/2016. She feels minimal improvement in breathing but does have more energy and her cough is now loose/wet. . She is taking medications as prescribed.

## 2017-02-17 MED FILL — TEMAZEPAM 15 MG CAPSULE: 15 | 30 days supply | Qty: 30 | Fill #0

## 2018-03-01 MED FILL — TEMAZEPAM 15 MG CAPSULE: 15 | 30 days supply | Qty: 30 | Fill #0

## 2018-09-23 ENCOUNTER — Emergency Department (HOSPITAL_COMMUNITY): Payer: Medicare Other

## 2018-09-23 ENCOUNTER — Inpatient Hospital Stay (HOSPITAL_COMMUNITY): Payer: Medicare Other

## 2018-09-23 ENCOUNTER — Other Ambulatory Visit: Payer: Self-pay

## 2018-09-23 ENCOUNTER — Encounter (HOSPITAL_COMMUNITY): Payer: Self-pay | Admitting: Neurology

## 2018-09-23 ENCOUNTER — Inpatient Hospital Stay (HOSPITAL_COMMUNITY)
Admission: EM | Admit: 2018-09-23 | Discharge: 2018-09-25 | DRG: 069 | Disposition: A | Discharge status: Home Health | Payer: Medicare Other | Attending: Internal Medicine | Admitting: Internal Medicine

## 2018-09-23 DIAGNOSIS — F32A Depression, unspecified: Secondary | ICD-10-CM | POA: Clinically undetermined

## 2018-09-23 DIAGNOSIS — I951 Orthostatic hypotension: Secondary | ICD-10-CM | POA: Diagnosis not present

## 2018-09-23 DIAGNOSIS — I358 Other nonrheumatic aortic valve disorders: Secondary | ICD-10-CM | POA: Diagnosis present

## 2018-09-23 DIAGNOSIS — Z7952 Long term (current) use of systemic steroids: Secondary | ICD-10-CM | POA: Diagnosis not present

## 2018-09-23 DIAGNOSIS — R8290 Unspecified abnormal findings in urine: Secondary | ICD-10-CM | POA: Diagnosis present

## 2018-09-23 DIAGNOSIS — R29701 NIHSS score 1: Secondary | ICD-10-CM | POA: Diagnosis present

## 2018-09-23 DIAGNOSIS — I10 Essential (primary) hypertension: Secondary | ICD-10-CM | POA: Diagnosis present

## 2018-09-23 DIAGNOSIS — Z8673 Personal history of transient ischemic attack (TIA), and cerebral infarction without residual deficits: Secondary | ICD-10-CM | POA: Diagnosis not present

## 2018-09-23 DIAGNOSIS — Z885 Allergy status to narcotic agent status: Secondary | ICD-10-CM

## 2018-09-23 DIAGNOSIS — Z7951 Long term (current) use of inhaled steroids: Secondary | ICD-10-CM | POA: Diagnosis not present

## 2018-09-23 DIAGNOSIS — Z1159 Encounter for screening for other viral diseases: Secondary | ICD-10-CM

## 2018-09-23 DIAGNOSIS — G8321 Monoplegia of upper limb affecting right dominant side: Secondary | ICD-10-CM | POA: Diagnosis present

## 2018-09-23 DIAGNOSIS — I491 Atrial premature depolarization: Secondary | ICD-10-CM | POA: Diagnosis present

## 2018-09-23 DIAGNOSIS — I639 Cerebral infarction, unspecified: Secondary | ICD-10-CM | POA: Diagnosis present

## 2018-09-23 DIAGNOSIS — G459 Transient cerebral ischemic attack, unspecified: Secondary | ICD-10-CM | POA: Diagnosis not present

## 2018-09-23 DIAGNOSIS — R000 Tachycardia, unspecified: Secondary | ICD-10-CM | POA: Diagnosis present

## 2018-09-23 DIAGNOSIS — Z7982 Long term (current) use of aspirin: Secondary | ICD-10-CM | POA: Diagnosis not present

## 2018-09-23 DIAGNOSIS — F329 Major depressive disorder, single episode, unspecified: Secondary | ICD-10-CM | POA: Diagnosis present

## 2018-09-23 DIAGNOSIS — N179 Acute kidney failure, unspecified: Secondary | ICD-10-CM | POA: Diagnosis present

## 2018-09-23 DIAGNOSIS — R531 Weakness: Secondary | ICD-10-CM | POA: Diagnosis not present

## 2018-09-23 DIAGNOSIS — Z9049 Acquired absence of other specified parts of digestive tract: Secondary | ICD-10-CM | POA: Diagnosis not present

## 2018-09-23 DIAGNOSIS — N289 Disorder of kidney and ureter, unspecified: Secondary | ICD-10-CM | POA: Diagnosis not present

## 2018-09-23 DIAGNOSIS — M4800 Spinal stenosis, site unspecified: Secondary | ICD-10-CM | POA: Diagnosis present

## 2018-09-23 DIAGNOSIS — E669 Obesity, unspecified: Secondary | ICD-10-CM | POA: Diagnosis present

## 2018-09-23 DIAGNOSIS — R278 Other lack of coordination: Secondary | ICD-10-CM | POA: Diagnosis present

## 2018-09-23 DIAGNOSIS — Z8249 Family history of ischemic heart disease and other diseases of the circulatory system: Secondary | ICD-10-CM

## 2018-09-23 DIAGNOSIS — F458 Other somatoform disorders: Secondary | ICD-10-CM | POA: Diagnosis present

## 2018-09-23 DIAGNOSIS — H04123 Dry eye syndrome of bilateral lacrimal glands: Secondary | ICD-10-CM | POA: Diagnosis present

## 2018-09-23 DIAGNOSIS — F0390 Unspecified dementia without behavioral disturbance: Secondary | ICD-10-CM | POA: Diagnosis present

## 2018-09-23 DIAGNOSIS — Z6835 Body mass index (BMI) 35.0-35.9, adult: Secondary | ICD-10-CM | POA: Diagnosis not present

## 2018-09-23 DIAGNOSIS — K5900 Constipation, unspecified: Secondary | ICD-10-CM | POA: Diagnosis present

## 2018-09-23 DIAGNOSIS — E785 Hyperlipidemia, unspecified: Secondary | ICD-10-CM | POA: Diagnosis present

## 2018-09-23 DIAGNOSIS — Z79899 Other long term (current) drug therapy: Secondary | ICD-10-CM

## 2018-09-23 DIAGNOSIS — R682 Dry mouth, unspecified: Secondary | ICD-10-CM | POA: Diagnosis present

## 2018-09-23 DIAGNOSIS — K219 Gastro-esophageal reflux disease without esophagitis: Secondary | ICD-10-CM | POA: Diagnosis present

## 2018-09-23 DIAGNOSIS — R251 Tremor, unspecified: Secondary | ICD-10-CM | POA: Diagnosis present

## 2018-09-23 DIAGNOSIS — R296 Repeated falls: Secondary | ICD-10-CM | POA: Diagnosis present

## 2018-09-23 DIAGNOSIS — R29810 Facial weakness: Secondary | ICD-10-CM | POA: Diagnosis present

## 2018-09-23 LAB — URINALYSIS, ROUTINE W REFLEX MICROSCOPIC
Bilirubin Urine: NEGATIVE
Glucose, UA: NEGATIVE mg/dL
Hgb urine dipstick: NEGATIVE
Ketones, ur: NEGATIVE mg/dL
Nitrite: POSITIVE — AB
Protein, ur: NEGATIVE mg/dL
Specific Gravity, Urine: 1.01 (ref 1.005–1.030)
pH: 7 (ref 5.0–8.0)

## 2018-09-23 LAB — COMPREHENSIVE METABOLIC PANEL
ALT: 17 U/L (ref 0–44)
AST: 22 U/L (ref 15–41)
Albumin: 3.1 g/dL — ABNORMAL LOW (ref 3.5–5.0)
Alkaline Phosphatase: 99 U/L (ref 38–126)
Anion gap: 11 (ref 5–15)
BUN: 22 mg/dL (ref 8–23)
CO2: 26 mmol/L (ref 22–32)
Calcium: 9.3 mg/dL (ref 8.9–10.3)
Chloride: 104 mmol/L (ref 98–111)
Creatinine, Ser: 1.11 mg/dL — ABNORMAL HIGH (ref 0.44–1.00)
GFR calc Af Amer: 52 mL/min — ABNORMAL LOW (ref >60)
GFR calc non Af Amer: 45 mL/min — ABNORMAL LOW (ref >60)
Glucose, Bld: 125 mg/dL — ABNORMAL HIGH (ref 70–99)
Potassium: 4.6 mmol/L (ref 3.5–5.1)
Sodium: 141 mmol/L (ref 135–145)
Total Bilirubin: 0.6 mg/dL (ref 0.3–1.2)
Total Protein: 6.3 g/dL — ABNORMAL LOW (ref 6.5–8.1)

## 2018-09-23 LAB — RAPID URINE DRUG SCREEN, HOSP PERFORMED
Amphetamines: NOT DETECTED
Barbiturates: NOT DETECTED
Benzodiazepines: NOT DETECTED
Cocaine: NOT DETECTED
Opiates: NOT DETECTED
Tetrahydrocannabinol: NOT DETECTED

## 2018-09-23 LAB — I-STAT CHEM 8, ED
BUN: 25 mg/dL — ABNORMAL HIGH (ref 8–23)
Calcium, Ion: 1.14 mmol/L — ABNORMAL LOW (ref 1.15–1.40)
Chloride: 106 mmol/L (ref 98–111)
Creatinine, Ser: 1.1 mg/dL — ABNORMAL HIGH (ref 0.44–1.00)
Glucose, Bld: 123 mg/dL — ABNORMAL HIGH (ref 70–99)
HCT: 43 % (ref 36.0–46.0)
Hemoglobin: 14.6 g/dL (ref 12.0–15.0)
Potassium: 4.5 mmol/L (ref 3.5–5.1)
Sodium: 139 mmol/L (ref 135–145)
TCO2: 30 mmol/L (ref 22–32)

## 2018-09-23 LAB — DIFFERENTIAL
Abs Immature Granulocytes: 0.03 10*3/uL (ref 0.00–0.07)
Basophils Absolute: 0.1 10*3/uL (ref 0.0–0.1)
Basophils Relative: 1 %
Eosinophils Absolute: 0.2 10*3/uL (ref 0.0–0.5)
Eosinophils Relative: 2 %
Immature Granulocytes: 0 %
Lymphocytes Relative: 29 %
Lymphs Abs: 2.4 10*3/uL (ref 0.7–4.0)
Monocytes Absolute: 0.5 10*3/uL (ref 0.1–1.0)
Monocytes Relative: 7 %
Neutro Abs: 5 10*3/uL (ref 1.7–7.7)
Neutrophils Relative %: 61 %

## 2018-09-23 LAB — CBC
HCT: 45.8 % (ref 36.0–46.0)
Hemoglobin: 14.1 g/dL (ref 12.0–15.0)
MCH: 29.4 pg (ref 26.0–34.0)
MCHC: 30.8 g/dL (ref 30.0–36.0)
MCV: 95.4 fL (ref 80.0–100.0)
Platelets: 213 10*3/uL (ref 150–400)
RBC: 4.8 MIL/uL (ref 3.87–5.11)
RDW: 13.6 % (ref 11.5–15.5)
WBC: 8.2 10*3/uL (ref 4.0–10.5)
nRBC: 0 % (ref 0.0–0.2)

## 2018-09-23 LAB — ETHANOL: Alcohol, Ethyl (B): <10 mg/dL (ref <10)

## 2018-09-23 LAB — CBG MONITORING, ED: Glucose-Capillary: 127 mg/dL — ABNORMAL HIGH (ref 70–99)

## 2018-09-23 LAB — PROTIME-INR
INR: 0.9 (ref 0.8–1.2)
Prothrombin Time: 12.1 seconds (ref 11.4–15.2)

## 2018-09-23 LAB — APTT: aPTT: 25 seconds (ref 24–36)

## 2018-09-23 MED ORDER — DONEPEZIL HCL 10 MG PO TABS
10.0000 mg | ORAL_TABLET | Freq: Every day | ORAL | Status: DC
  Administered 2018-09-23 – 2018-09-24 (×2): 10 mg via ORAL
  Filled 2018-09-23 (×2): qty 1

## 2018-09-23 MED ORDER — ACETAMINOPHEN 650 MG RE SUPP: 650.0000 mg | RECTAL | Status: DC | PRN

## 2018-09-23 MED ORDER — FESOTERODINE FUMARATE ER 8 MG PO TB24
8.0000 mg | ORAL_TABLET | Freq: Every day | ORAL | Status: DC
  Administered 2018-09-24 – 2018-09-25 (×2): 8 mg via ORAL
  Filled 2018-09-23 (×2): qty 1

## 2018-09-23 MED ORDER — DOCUSATE SODIUM 100 MG PO CAPS: 100.0000 mg | ORAL_CAPSULE | ORAL | Status: DC | PRN

## 2018-09-23 MED ORDER — ACETAMINOPHEN 160 MG/5ML PO SOLN: 650.0000 mg | ORAL | Status: DC | PRN

## 2018-09-23 MED ORDER — PANTOPRAZOLE SODIUM 40 MG PO TBEC
40.0000 mg | DELAYED_RELEASE_TABLET | Freq: Every day | ORAL | Status: DC
  Administered 2018-09-24 – 2018-09-25 (×2): 40 mg via ORAL
  Filled 2018-09-23 (×2): qty 1

## 2018-09-23 MED ORDER — ASPIRIN 300 MG RE SUPP: 300.0000 mg | Freq: Every day | RECTAL | Status: DC

## 2018-09-23 MED ORDER — ACETAMINOPHEN 325 MG PO TABS
650.0000 mg | ORAL_TABLET | ORAL | Status: DC | PRN
  Administered 2018-09-24: 650 mg via ORAL
  Filled 2018-09-23: qty 2

## 2018-09-23 MED ORDER — DULOXETINE HCL 60 MG PO CPEP
60.0000 mg | ORAL_CAPSULE | Freq: Two times a day (BID) | ORAL | Status: DC
  Administered 2018-09-23 – 2018-09-25 (×4): 60 mg via ORAL
  Filled 2018-09-23 (×4): qty 1

## 2018-09-23 MED ORDER — SODIUM CHLORIDE 0.9 % IV SOLN
Freq: Once | INTRAVENOUS | Status: AC
  Administered 2018-09-23: 18:00:00 via INTRAVENOUS

## 2018-09-23 MED ORDER — HYDRALAZINE HCL 20 MG/ML IJ SOLN: 10.0000 mg | INTRAMUSCULAR | Status: DC | PRN

## 2018-09-23 MED ORDER — SENNOSIDES-DOCUSATE SODIUM 8.6-50 MG PO TABS: 1.0000 | ORAL_TABLET | Freq: Every evening | ORAL | Status: DC | PRN

## 2018-09-23 MED ORDER — ATORVASTATIN CALCIUM 80 MG PO TABS
80.0000 mg | ORAL_TABLET | Freq: Every day | ORAL | Status: DC
  Administered 2018-09-23: 80 mg via ORAL
  Filled 2018-09-23: qty 1

## 2018-09-23 MED ORDER — ENOXAPARIN SODIUM 40 MG/0.4ML ~~LOC~~ SOLN
40.0000 mg | SUBCUTANEOUS | Status: DC
  Administered 2018-09-23 – 2018-09-24 (×2): 40 mg via SUBCUTANEOUS
  Filled 2018-09-23 (×2): qty 0.4

## 2018-09-23 MED ORDER — ASPIRIN 325 MG PO TABS
325.0000 mg | ORAL_TABLET | Freq: Every day | ORAL | Status: DC
  Administered 2018-09-23: 325 mg via ORAL
  Filled 2018-09-23 (×2): qty 1

## 2018-09-23 MED ORDER — GADOBUTROL 1 MMOL/ML IV SOLN
8.0000 mL | Freq: Once | INTRAVENOUS | Status: AC | PRN
  Administered 2018-09-23: 20:00:00 8 mL via INTRAVENOUS

## 2018-09-23 MED ORDER — STROKE: EARLY STAGES OF RECOVERY BOOK
Freq: Once | Status: AC
  Administered 2018-09-23: 1
  Filled 2018-09-23: qty 1

## 2018-09-23 NOTE — ED Notes (Signed)
Admitting at patient bedside.

## 2018-09-23 NOTE — ED Notes (Signed)
IV team at bedside 

## 2018-09-23 NOTE — ED Triage Notes (Signed)
Pt BIB [facility]. Per EMS patient woke up this morning with some right sided weakness. Per EMS family reported that the patient had some difficulty using her right arm this morning and it was cold this morning. Pt also reporting her dizziness is worse than normal. Pt normally uses a walker at baseline. EMS reports patient last seen normal was at 2200 last evening. Pt reports she does still feel weak in her right arm.

## 2018-09-23 NOTE — ED Notes (Signed)
Patient transported to CT 

## 2018-09-23 NOTE — ED Notes (Signed)
ED TO INPATIENT HANDOFF REPORT  ED Nurse Name and Phone #:  Joni Reiningicole [phone]  S Name/Age/Gender Megan DessMary Helen Livingston 85 y.o. female Room/Bed: 019C/019C  Code Status   Code Status: Full Code  Home/SNF/Other Home Patient oriented to: self, place, time and situation Is this baseline? Yes   Triage Complete: Triage complete  Chief Complaint Dizziness  Triage Note Pt BIB [facility]. Per EMS patient woke up this morning with some right sided weakness. Per EMS family reported that the patient had some difficulty using her right arm this morning and it was cold this morning. Pt also reporting her dizziness is worse than normal. Pt normally uses a walker at baseline. EMS reports patient last seen normal was at 2200 last evening. Pt reports she does still feel weak in her right arm.    Allergies Allergies  Allergen Reactions  . Oxycodone Other (See Comments)    hypotension    Level of Care/Admitting Diagnosis ED Disposition    ED Disposition Condition Comment   Admit  [facility]ospital [facility] [[id number]]  Level of Care: Telemetry Medical [104]  Covid Evaluation: Asymptomatic Screening Protocol (No Symptoms)  Diagnosis: CVA (cerebral vascular accident) [facility](HCC) [[id number]][[id number]]  Admitting Physician: Clydie BraunSMITH, RONDELL A [[id number]]  Attending Physician: Clydie BraunSMITH, RONDELL A [[id number]]  Estimated length of stay: past midnight tomorrow  Certification:: I certify this patient will need inpatient services for at least 2 midnights  PT Class (Do Not Modify): Inpatient [101]  PT Acc Code (Do Not Modify): Private [1]       B Medical/Surgery History Past Medical History:  Diagnosis Date  . Depression   . Hypertension   . Stroke [facility](HCC)    Past Surgical History:  Procedure Laterality Date  . BACK SURGERY    . CHOLECYSTECTOMY    . JOINT REPLACEMENT       A IV Location/Drains/Wounds Patient Lines/Drains/Airways Status   Active Line/Drains/Airways    Name:   Placement date:   Placement  time:   Site:   Days:   Peripheral IV 09/23/18 Right;Anterior Forearm   09/23/18    1136    Forearm   less than 1          Intake/Output Last 24 hours No intake or output data in the 24 hours ending 09/23/18 1712  Labs/Imaging Results for orders placed or performed during the hospital encounter of 09/23/18 (from the past 48 hour(s))  Ethanol     Status: None   Collection Time: 09/23/18 10:24 AM  Result Value Ref Range   Alcohol, Ethyl (B) <10 <10 mg/dL    Comment: (NOTE) Lowest detectable limit for serum alcohol is 10 mg/dL. For medical purposes only. Performed at [facility], [address] [place], [place] [address]   Protime-INR     Status: None   Collection Time: 09/23/18 10:24 AM  Result Value Ref Range   Prothrombin Time 12.1 11.4 - 15.2 seconds   INR 0.9 0.8 - 1.2    Comment: (NOTE) INR goal varies based on device and disease states. Performed at [facility], [address] [place], [place] [address]   APTT     Status: None   Collection Time: 09/23/18 10:24 AM  Result Value Ref Range   aPTT 25 24 - 36 seconds    Comment: Performed at [facility], [address] [place], [place] [address]  CBC     Status: None   Collection Time: 09/23/18 10:24 AM  Result  Value Ref Range   WBC 8.2 4.0 - 10.5 K/uL   RBC 4.80 3.87 - 5.11 MIL/uL   Hemoglobin 14.1 12.0 - 15.0 g/dL   HCT 45.8 36.0 - 46.0 %   MCV 95.4 80.0 - 100.0 fL   MCH 29.4 26.0 - 34.0 pg   MCHC 30.8 30.0 - 36.0 g/dL   RDW 13.6 11.5 - 15.5 %   Platelets 213 150 - 400 K/uL    Comment: REPEATED TO VERIFY SPECIMEN CHECKED FOR CLOTS    nRBC 0.0 0.0 - 0.2 %    Comment: Performed at [facility], [place] [address] [place], [place] [address]  Differential     Status: None   Collection Time: 09/23/18 10:24 AM  Result Value Ref Range   Neutrophils Relative % 61 %   Neutro Abs 5.0 1.7 - 7.7 K/uL   Lymphocytes Relative 29 %   Lymphs Abs 2.4 0.7 - 4.0 K/uL   Monocytes Relative 7 %    Monocytes Absolute 0.5 0.1 - 1.0 K/uL   Eosinophils Relative 2 %   Eosinophils Absolute 0.2 0.0 - 0.5 K/uL   Basophils Relative 1 %   Basophils Absolute 0.1 0.0 - 0.1 K/uL   Immature Granulocytes 0 %   Abs Immature Granulocytes 0.03 0.00 - 0.07 K/uL    Comment: Performed at [facility], [place] [address], [place], [place] [address]  Comprehensive metabolic panel     Status: Abnormal   Collection Time: 09/23/18 10:24 AM  Result Value Ref Range   Sodium 141 135 - 145 mmol/L   Potassium 4.6 3.5 - 5.1 mmol/L   Chloride 104 98 - 111 mmol/L   CO2 26 22 - 32 mmol/L   Glucose, Bld 125 (H) 70 - 99 mg/dL   BUN 22 8 - 23 mg/dL   Creatinine, Ser 1.11 (H) 0.44 - 1.00 mg/dL   Calcium 9.3 8.9 - 10.3 mg/dL   Total Protein 6.3 (L) 6.5 - 8.1 g/dL   Albumin 3.1 (L) 3.5 - 5.0 g/dL   AST 22 15 - 41 U/L   ALT 17 0 - 44 U/L   Alkaline Phosphatase 99 38 - 126 U/L   Total Bilirubin 0.6 0.3 - 1.2 mg/dL   GFR calc non Af Amer 45 (L) >60 mL/min   GFR calc Af Amer 52 (L) >60 mL/min   Anion gap 11 5 - 15    Comment: Performed at [place] [address], [place], [place] [address]  CBG monitoring, ED     Status: Abnormal   Collection Time: 09/23/18 10:55 AM  Result Value Ref Range   Glucose-Capillary 127 (H) 70 - 99 mg/dL   Comment 1 Notify RN    Comment 2 Document in Chart   I-stat chem 8, ED     Status: Abnormal   Collection Time: 09/23/18 11:31 AM  Result Value Ref Range   Sodium 139 135 - 145 mmol/L   Potassium 4.5 3.5 - 5.1 mmol/L   Chloride 106 98 - 111 mmol/L   BUN 25 (H) 8 - 23 mg/dL   Creatinine, Ser 1.10 (H) 0.44 - 1.00 mg/dL   Glucose, Bld 123 (H) 70 - 99 mg/dL   Calcium, Ion 1.14 (L) 1.15 - 1.40 mmol/L   TCO2 30 22 - 32 mmol/L   Hemoglobin 14.6 12.0 - 15.0 g/dL   HCT 43.0 36.0 - 46.0 %  Urinalysis, Routine w reflex microscopic     Status: Abnormal  Collection Time: 09/23/18  2:20 PM  Result Value Ref Range   Color, Urine YELLOW YELLOW   APPearance HAZY (A) CLEAR    Specific Gravity, Urine 1.010 1.005 - 1.030   pH 7.0 5.0 - 8.0   Glucose, UA NEGATIVE NEGATIVE mg/dL   Hgb urine dipstick NEGATIVE NEGATIVE   Bilirubin Urine NEGATIVE NEGATIVE   Ketones, ur NEGATIVE NEGATIVE mg/dL   Protein, ur NEGATIVE NEGATIVE mg/dL   Nitrite POSITIVE (A) NEGATIVE   Leukocytes,Ua SMALL (A) NEGATIVE   RBC / HPF 0-5 0 - 5 RBC/hpf   WBC, UA 6-10 0 - 5 WBC/hpf   Bacteria, UA MANY (A) NONE SEEN   Squamous Epithelial / LPF 0-5 0 - 5   Mucus PRESENT     Comment: Performed at [facility], [address] [place], [place] [address]  Urine rapid drug screen (hosp performed)     Status: None   Collection Time: 09/23/18  2:30 PM  Result Value Ref Range   Opiates NONE DETECTED NONE DETECTED   Cocaine NONE DETECTED NONE DETECTED   Benzodiazepines NONE DETECTED NONE DETECTED   Amphetamines NONE DETECTED NONE DETECTED   Tetrahydrocannabinol NONE DETECTED NONE DETECTED   Barbiturates NONE DETECTED NONE DETECTED    Comment: (NOTE) DRUG SCREEN FOR MEDICAL PURPOSES ONLY.  IF CONFIRMATION IS NEEDED FOR ANY PURPOSE, NOTIFY LAB WITHIN 5 DAYS. LOWEST DETECTABLE LIMITS FOR URINE DRUG SCREEN Drug Class                     Cutoff (ng/mL) Amphetamine and metabolites    1000 Barbiturate and metabolites    200 Benzodiazepine                 200 Tricyclics and metabolites     300 Opiates and metabolites        300 Cocaine and metabolites        300 THC                            50 Performed at [facility], [address] [place], [place] [address]    Ct Head Wo Contrast  Result Date: 09/23/2018 CLINICAL DATA:  Right-sided weakness EXAM: CT HEAD WITHOUT CONTRAST TECHNIQUE: Contiguous axial images were obtained from the base of the skull through the vertex without intravenous contrast. COMPARISON:  None. FINDINGS: Brain: Chronic atrophic and white matter ischemic changes are noted. No findings to suggest acute hemorrhage, acute infarction or space-occupying mass lesion  are noted. Vascular: No hyperdense vessel or unexpected calcification. Skull: Normal. Negative for fracture or focal lesion. Sinuses/Orbits: No acute finding. Other: None. IMPRESSION: Chronic atrophic and ischemic changes without acute abnormality. Electronically Signed   By: Alcide CleverMark  Lukens M.D.   On: 09/23/2018 13:23    Pending Labs Unresulted Labs (From admission, onward)    Start     Ordered   09/24/18 0500  Hemoglobin A1c  Tomorrow morning,   R     09/23/18 1529   09/24/18 0500  Lipid panel  Tomorrow morning,   R    Comments: Fasting    09/23/18 1529   09/23/18 1654  Urine Culture  Add-on,   AD     09/23/18 1653   09/23/18 1035  Novel Coronavirus,NAA,(SEND-OUT TO REF LAB - TAT 24-48 hrs); Hosp Order  (Asymptomatic Patients Labs)  Once,   STAT    Question:  Rule Out  Answer:  Yes  09/23/18 1035          Vitals/Pain Today's Vitals   09/23/18 1510 09/23/18 1556 09/23/18 1615 09/23/18 1630  BP:  (!) 172/73 (!) 165/77 (!) 173/73  Pulse:  70 68 66  Resp:  14 15 15   Temp:      TempSrc:      SpO2: 94% 94% 97% 97%    Isolation Precautions No active isolations  Medications Medications  0.9 %  sodium chloride infusion (has no administration in time range)  acetaminophen (TYLENOL) tablet 650 mg (has no administration in time range)    Or  acetaminophen (TYLENOL) solution 650 mg (has no administration in time range)    Or  acetaminophen (TYLENOL) suppository 650 mg (has no administration in time range)  senna-docusate (Senokot-S) tablet 1 tablet (has no administration in time range)  enoxaparin (LOVENOX) injection 40 mg (has no administration in time range)  aspirin suppository 300 mg ( Rectal See Alternative 09/23/18 1623)    Or  aspirin tablet 325 mg (325 mg Oral Given 09/23/18 1623)  donepezil (ARICEPT) tablet 10 mg (has no administration in time range)  docusate sodium (COLACE) capsule 100 mg (has no administration in time range)  pantoprazole (PROTONIX) EC tablet 40 mg (has  no administration in time range)  fesoterodine (TOVIAZ) tablet 8 mg (has no administration in time range)  atorvastatin (LIPITOR) tablet 80 mg (has no administration in time range)  DULoxetine (CYMBALTA) DR capsule 60 mg (has no administration in time range)  hydrALAZINE (APRESOLINE) injection 10 mg (has no administration in time range)   stroke: mapping our early stages of recovery book (1 each Does not apply Given 09/23/18 1624)    Mobility walks with device     Focused Assessments Neuro Assessment Handoff:  Swallow screen pass? Yes    NIH Stroke Scale ( + Modified Stroke Scale Criteria)  Interval: Initial Level of Consciousness (1a.)   : Alert, keenly responsive LOC Questions (1b. )   +: Answers both questions correctly LOC Commands (1c. )   + : Performs both tasks correctly Best Gaze (2. )  +: Normal Visual (3. )  +: No visual loss Facial Palsy (4. )    : Normal symmetrical movements Motor Arm, Left (5a. )   +: No drift Motor Arm, Right (5b. )   +: Drift Motor Leg, Left (6a. )   +: No drift Motor Leg, Right (6b. )   +: No drift Limb Ataxia (7. ): Absent Sensory (8. )   +: Mild-to-moderate sensory loss, patient feels pinprick is less sharp or is dull on the affected side, or there is a loss of superficial pain with pinprick, but patient is aware of being touched Best Language (9. )   +: No aphasia Dysarthria (10. ): Normal Extinction/Inattention (11.)   +: No Abnormality Modified SS Total  +: 2 Complete NIHSS TOTAL: 2 Last date known well: 09/22/18 Last time known well: 2200 Neuro Assessment:   Neuro Checks:   Initial (09/23/18 1030)  Last Documented NIHSS Modified Score: 2 (09/23/18 1430) Has TPA been given? No If patient is a Neuro Trauma and patient is going to OR before floor call report to 4N Charge nurse: [phone] or [phone]     R Recommendations: See Admitting Provider Note  Report given to:   Additional Notes:

## 2018-09-23 NOTE — ED Notes (Signed)
Pt's family given warm blanket

## 2018-09-23 NOTE — Code Documentation (Signed)
This RN was called to evaluate patient upon arrival. LKW at 2030 when patient went to bed last night per family and patient. This morning, pt woke up and reported dizziness along with some right arm weakness. Upon Initial Assessment, pt has NIHSS 2 for right arm drift and right sided sensory decreases. See Flowsheet for full NIHSS exam. No changes in speech. Pt is outside the 4.5 hour window for medication and does not have any signs of LVO. Family updated. Attempted IV with no success. ED RN called IV Team to have IV placed. Handoff given to [place], Therapist, sports.

## 2018-09-23 NOTE — H&P (Signed)
History and Physical    Megan Livingston ZOX:[id number] DOB: 1930/10/22 DOA: 09/23/2018  Referring MD/NP/PA: Lorre NickAnthony Allen, MD PCP: System, Pcp Not In  Patient coming from: Home via EMS  Chief Complaint: Right arm weakness  I have personally briefly reviewed patient's old medical records in [facility]   HPI: Megan Livingston is a 85 y.o. right-handed female with medical history significant of CVA, HTN, and depression; who presents admitted with complaints of right arm weakness which started this morning upon waking up around 8 AM.  Last noted to be normal prior to going to sleep last night around 8:30 PM.  At baseline patient normally ambulates with use of a walker and has nursing aides to help watch after her during the day.  Upon waking up she normally puts in her hearing aid but was having difficulty getting them into her ear and needed the aids help.  Upon standing she reported feeling dizzy as though the room were spinning around her.  Denied having any focal weakness in her legs and did not fall.  She tried to eat breakfast and take her morning pills.  However, noted that she had difficulty looking up the pills and dropped some and she kept missing her mouth while trying to feed herself.  Denies having any headache, change in vision, cough, shortness of breath, chest pain, palpitations, fever, abdominal pain, nausea, vomiting, or diarrhea.  ED Course: Upon admission to the emergency department patient was noted to be afebrile with blood pressures elevated up to 184/68, and all other vital signs relatively within normal limits.  Patient was seen as a code stroke, but did not meet criteria for TPA.  CT scan of the brain noted chronic atrophy and ischemic changes, but no acute abnormality.  Labs revealed BUN 25 and creatinine 1.1.  Urinalysis was positive for signs of infection.  TRH was called to admit to complete stroke work-up.    Review of Systems  Constitutional: Negative for chills  and fever.  HENT: Positive for hearing loss. Negative for ear discharge and nosebleeds.   Eyes: Negative for blurred vision and discharge.  Respiratory: Negative for cough and shortness of breath.   Cardiovascular: Negative for chest pain and palpitations.  Gastrointestinal: Negative for abdominal pain, nausea and vomiting.  Genitourinary: Negative for dysuria and hematuria.  Musculoskeletal: Positive for back pain. Negative for falls.  Neurological: Positive for dizziness, focal weakness and loss of consciousness. Negative for headaches.  Psychiatric/Behavioral: Negative for substance abuse.    Past Medical History:  Diagnosis Date   Depression    Hypertension    Stroke [facility](HCC)     Past Surgical History:  Procedure Laterality Date   BACK SURGERY     CHOLECYSTECTOMY     JOINT REPLACEMENT       reports that she has never smoked. She has never used smokeless tobacco. She reports that she does not drink alcohol. No history on file for drug.  Allergies  Allergen Reactions   Oxycodone Other (See Comments)    hypotension    No family history on file.  Prior to Admission medications   Medication Sig Start Date End Date Taking? Authorizing Provider  acetaminophen (TYLENOL) 325 MG tablet Take 650 mg by mouth every 6 (six) hours as needed for mild pain or headache.   Yes [provider]  aspirin 81 MG chewable tablet Chew by mouth daily.   Yes [provider]  cholecalciferol (VITAMIN D3) 25 MCG (1000 UT) tablet Take  1,000 Units by mouth daily.   Yes [provider]  docusate sodium (COLACE) 100 MG capsule Take 100 mg by mouth as needed for mild constipation.   Yes [provider]  donepezil (ARICEPT) 10 MG tablet Take 10 mg by mouth at bedtime.   Yes [provider]  DULoxetine (CYMBALTA) 60 MG capsule Take 60 mg by mouth 2 (two) times daily.    Yes [provider]  losartan (COZAAR) 100 MG tablet Take 100 mg by mouth  daily.   Yes [provider]  metoprolol tartrate (LOPRESSOR) 25 MG tablet Take 12.5 mg by mouth 2 (two) times daily.    Yes [provider]  omeprazole (PRILOSEC) 40 MG capsule Take 40 mg by mouth daily.    Yes [provider]  simvastatin (ZOCOR) 40 MG tablet Take 60 mg by mouth daily at 6 PM. taking 1 & 1/2 tablet daily=60mg    Yes [provider]  tolterodine (DETROL LA) 4 MG 24 hr capsule Take 4 mg by mouth daily.   Yes [provider]  triamterene-hydrochlorothiazide (MAXZIDE-25) 37.5-25 MG tablet Take 1 tablet by mouth daily.   Yes [provider]  albuterol (PROVENTIL HFA;VENTOLIN HFA) 108 (90 Base) MCG/ACT inhaler Inhale 1-2 puffs into the lungs every 6 (six) hours as needed for wheezing or shortness of breath. Patient not taking: Reported on 09/23/2018 03/18/16   Lurene ShadowPhelps, Erin O, PA-C  azithromycin (ZITHROMAX) 250 MG tablet Take 1 tablet (250 mg total) by mouth daily. Take first 2 tablets together, then 1 every day until finished. Patient not taking: Reported on 09/23/2018 03/18/16   Lurene ShadowPhelps, Erin O, PA-C  budesonide-formoterol [facility](SYMBICORT) 80-4.5 MCG/ACT inhaler Inhale 2 puffs into the lungs 2 (two) times daily. Patient not taking: Reported on 09/23/2018 03/06/16   Lajean ManesMassey, David, MD  fluticasone [facility](FLONASE) 50 MCG/ACT nasal spray 1 or 2 sprays each nostril twice a day Patient not taking: Reported on 09/23/2018 03/06/16   Lajean ManesMassey, David, MD  predniSONE (DELTASONE) 20 MG tablet 3 tabs po day one, then 2 po daily x 4 days Patient not taking: Reported on 09/23/2018 03/18/16   Lurene ShadowPhelps, Erin O, PA-C  Spacer/Aero-Holding Chambers (AEROCHAMBER PLUS WITH MASK) inhaler Use as instructed Patient not taking: Reported on 09/23/2018 03/18/16   Lurene ShadowPhelps, Erin O, PA-C    Physical Exam:  Constitutional: Elderly female in NAD, calm, comfortable Vitals:   09/23/18 1345 09/23/18 1400 09/23/18 1415 09/23/18 1445  BP: (!) 169/78 (!) 181/84 (!) 184/68 (!) 178/89  Pulse:  64 65 68 66  Resp: 14 15 12 16   Temp:      TempSrc:      SpO2: 100% 99% 99% 99%   Eyes: PERRL, lids and conjunctivae normal ENMT: Mucous membranes are moist. Posterior pharynx clear of any exudate or lesions. Hearing aids present Neck: normal, supple, no masses, no thyromegaly Respiratory: clear to auscultation bilaterally, no wheezing, no crackles. Normal respiratory effort. No accessory muscle use.  Cardiovascular: Regular rate and rhythm, no murmurs / rubs / gallops. No extremity edema. 2+ pedal pulses. No carotid bruits.  Abdomen: no tenderness, no masses palpated. No hepatosplenomegaly. Bowel sounds positive.  Musculoskeletal: no clubbing / cyanosis. No joint deformity upper and lower extremities. Good ROM, no contractures. Normal muscle tone.  Skin: no rashes, lesions, ulcers. No induration Neurologic: CN 2-12 grossly intact. Sensation intact, DTR normal. Strength 5/5 in all 4.  Psychiatric: Normal judgment and insight. Alert and oriented x 3. Normal mood.     Labs on Admission:  I have personally reviewed following labs and imaging studies  CBC: Recent Labs  Lab 09/23/18 1024 09/23/18 1131  WBC 8.2  --   NEUTROABS 5.0  --   HGB 14.1 14.6  HCT 45.8 43.0  MCV 95.4  --   PLT 213  --    Basic Metabolic Panel: Recent Labs  Lab 09/23/18 1024 09/23/18 1131  NA 141 139  K 4.6 4.5  CL 104 106  CO2 26  --   GLUCOSE 125* 123*  BUN 22 25*  CREATININE 1.11* 1.10*  CALCIUM 9.3  --    GFR: CrCl cannot be calculated (Unknown ideal weight.). Liver Function Tests: Recent Labs  Lab 09/23/18 1024  AST 22  ALT 17  ALKPHOS 99  BILITOT 0.6  PROT 6.3*  ALBUMIN 3.1*   No results for input(s): LIPASE, AMYLASE in the last 168 hours. No results for input(s): AMMONIA in the last 168 hours. Coagulation Profile: Recent Labs  Lab 09/23/18 1024  INR 0.9   Cardiac Enzymes: No results for input(s): CKTOTAL, CKMB, CKMBINDEX, TROPONINI in the last 168 hours. BNP (last 3  results) No results for input(s): PROBNP in the last 8760 hours. HbA1C: No results for input(s): HGBA1C in the last 72 hours. CBG: Recent Labs  Lab 09/23/18 1055  GLUCAP 127*   Lipid Profile: No results for input(s): CHOL, HDL, LDLCALC, TRIG, CHOLHDL, LDLDIRECT in the last 72 hours. Thyroid Function Tests: No results for input(s): TSH, T4TOTAL, FREET4, T3FREE, THYROIDAB in the last 72 hours. Anemia Panel: No results for input(s): VITAMINB12, FOLATE, FERRITIN, TIBC, IRON, RETICCTPCT in the last 72 hours. Urine analysis: No results found for: COLORURINE, APPEARANCEUR, LABSPEC, PHURINE, GLUCOSEU, HGBUR, BILIRUBINUR, KETONESUR, PROTEINUR, UROBILINOGEN, NITRITE, LEUKOCYTESUR Sepsis Labs: No results found for this or any previous visit (from the past 240 hour(s)).   Radiological Exams on Admission: Ct Head Wo Contrast  Result Date: 09/23/2018 CLINICAL DATA:  Right-sided weakness EXAM: CT HEAD WITHOUT CONTRAST TECHNIQUE: Contiguous axial images were obtained from the base of the skull through the vertex without intravenous contrast. COMPARISON:  None. FINDINGS: Brain: Chronic atrophic and white matter ischemic changes are noted. No findings to suggest acute hemorrhage, acute infarction or space-occupying mass lesion are noted. Vascular: No hyperdense vessel or unexpected calcification. Skull: Normal. Negative for fracture or focal lesion. Sinuses/Orbits: No acute finding. Other: None. IMPRESSION: Chronic atrophic and ischemic changes without acute abnormality. Electronically Signed   By: Inez Catalina M.D.   On: 09/23/2018 13:23    EKG: Independently reviewed.  Sinus rhythm at 68 bpm with premature atrial complex present  Assessment/Plan Right-handed weakness secondary to suspected CVA: Acute.  Patient reports decreased ability to utilize her right hand upon waking up this morning.  Initial CT scan negative for any acute abnormality.  Neurology consulted and recommending work-up. - Admit to  telemetry bed - Stroke order set initiated - Neuro checks - Check  MRI/MRA head and neck - PT/OT/Speech to eval and treat - Check echocardiogram - Check Hemoglobin A1c and lipid panel in a.m. - ASA - Appreciate neurology consultative services, will follow-up  - Social work consult   Essential hypertension: Allowing for permissive hypertension in the setting of suspected acute stroke. -Restart home blood pressure medications when medically appropriate -Hydralazine IV as needed for blood pressures greater than 220/120  Abnormal UA: Patient does not report any significant symptoms of the UTI including frequency or dysuria.  However, UA positive for nitrite, small leukocytes, many bacteria, and 6-10 WBCs.  Daughter would  like to hold off treating with antibiotics until culture. -Follow-up urine culture  Renal insufficiency: Admission creatinine noted to be just mildly elevated at 1.1 with a BUN 25.  Elevated BUN and creatinine suggest prerenal cause of symptoms. -Encourage p.o. intake -Gentle IV fluids of normal saline at 50 mL/h overnight -Recheck creatinine in a.m.  Dementia: Patient has mild dementia. -Continue Aricept  Depression -Continue Cymbalta  Hyperlipidemia: Home medications include simvastatin 60 mg daily. -Follow-up lipid panel -Atorvastatin 80 mg daily  GERD  -Continue pharmacy substitution of Protonix for Prilosec  DVT prophylaxis: lovenox Code Status: Full Family Communication: Discussed plan of care with the patient and daughter present at bedside Disposition Plan: To be determined Consults called: Neurology Admission status:  Inpatient  Clydie Braunondell A Terrina Docter MD [facility] Pager [phone]   If 7PM-7AM, please contact night-coverage [email or web address] Password [email or web address]  09/23/2018, 3:07 PM

## 2018-09-23 NOTE — ED Provider Notes (Signed)
[facility] Provider Note   CSN: [id number] Arrival date & time: 09/23/18  1009     History   Chief Complaint Chief Complaint  Patient presents with  . Right Arm Weakness  . Dizziness    HPI Edmonia JamesMary Helen Ogborn is a 85 y.o. female.     85 year old female presents with right upper extremity weakness which she first noticed at 8:00 this morning.  This will be her last seen normal.  States that she had trouble with trying to pick up things and grab things.  She is normally right-handed and does play the piano at baseline.  She uses a walker to ambulate and had trouble doing that as well.  Denies any headache.  No confusion.  No change to her speech.  Does have a prior history of CVA but without residual extremity weakness.  Denies any recent falls.  She has not been dizzy.  No recent fever or chills.  Presents via EMS     Past Medical History:  Diagnosis Date  . Depression   . Hypertension   . Stroke [facility](HCC)     There are no active problems to display for this patient.   Past Surgical History:  Procedure Laterality Date  . BACK SURGERY    . CHOLECYSTECTOMY    . JOINT REPLACEMENT       OB History   No obstetric history on file.      Home Medications    Prior to Admission medications   Medication Sig Start Date End Date Taking? Authorizing Provider  albuterol (PROVENTIL HFA;VENTOLIN HFA) 108 (90 Base) MCG/ACT inhaler Inhale 1-2 puffs into the lungs every 6 (six) hours as needed for wheezing or shortness of breath. 03/18/16   Lurene ShadowPhelps, Erin O, PA-C  aspirin 81 MG chewable tablet Chew by mouth daily.    [provider]  azithromycin (ZITHROMAX) 250 MG tablet Take 1 tablet (250 mg total) by mouth daily. Take first 2 tablets together, then 1 every day until finished. 03/18/16   Lurene ShadowPhelps, Erin O, PA-C  budesonide-formoterol (SYMBICORT) 80-4.5 MCG/ACT inhaler Inhale 2 puffs into the lungs 2 (two) times daily. 03/06/16   Lajean ManesMassey, David,  MD  DULoxetine (CYMBALTA) 20 MG capsule Take 20 mg by mouth daily.    [provider]  fluticasone [facility](FLONASE) 50 MCG/ACT nasal spray 1 or 2 sprays each nostril twice a day 03/06/16   Lajean ManesMassey, David, MD  losartan (COZAAR) 100 MG tablet Take 100 mg by mouth daily.    [provider]  metoprolol (LOPRESSOR) 100 MG tablet Take 100 mg by mouth 2 (two) times daily.    [provider]  omeprazole (PRILOSEC) 10 MG capsule Take 10 mg by mouth daily.    [provider]  predniSONE (DELTASONE) 20 MG tablet 3 tabs po day one, then 2 po daily x 4 days 03/18/16   Lurene ShadowPhelps, Erin O, PA-C  simvastatin (ZOCOR) 10 MG tablet Take 10 mg by mouth daily.    [provider]  Spacer/Aero-Holding Chambers (AEROCHAMBER PLUS WITH MASK) inhaler Use as instructed 03/18/16   Lurene ShadowPhelps, Erin O, PA-C  temazepam (RESTORIL) 15 MG capsule Take 15 mg by mouth at bedtime as needed for sleep.    [provider]    Family History No family history on file.  Social History Social History   Tobacco Use  . Smoking status: Never Smoker  . Smokeless tobacco: Never Used  Substance Use Topics  . Alcohol use: No  .  Drug use: Not on file     Allergies   Patient has no known allergies.   Review of Systems Review of Systems  All other systems reviewed and are negative.    Physical Exam Updated Vital Signs There were no vitals taken for this visit.  Physical Exam Vitals signs and nursing note reviewed.  Constitutional:      General: She is not in acute distress.    Appearance: Normal appearance. She is well-developed. She is not toxic-appearing.  HENT:     Head: Normocephalic and atraumatic.  Eyes:     General: Lids are normal.     Conjunctiva/sclera: Conjunctivae normal.     Pupils: Pupils are equal, round, and reactive to light.  Neck:     Musculoskeletal: Normal range of motion and neck supple.     Thyroid: No thyroid mass.     Trachea: No tracheal deviation.   Cardiovascular:     Rate and Rhythm: Normal rate and regular rhythm.     Heart sounds: Normal heart sounds. No murmur. No gallop.   Pulmonary:     Effort: Pulmonary effort is normal. No respiratory distress.     Breath sounds: Normal breath sounds. No stridor. No decreased breath sounds, wheezing, rhonchi or rales.  Abdominal:     General: Bowel sounds are normal. There is no distension.     Palpations: Abdomen is soft.     Tenderness: There is no abdominal tenderness. There is no rebound.  Musculoskeletal: Normal range of motion.        General: No tenderness.  Skin:    General: Skin is warm and dry.     Findings: No abrasion or rash.  Neurological:     Mental Status: She is alert and oriented to person, place, and time.     GCS: GCS eye subscore is 4. GCS verbal subscore is 5. GCS motor subscore is 6.     Cranial Nerves: No cranial nerve deficit.     Sensory: No sensory deficit.     Motor: Weakness present. No tremor.     Coordination: Finger-Nose-Finger Test abnormal.     Comments: Right upper extremity strength is 4/5 Right upper extremity dysmetria noted.  No facial asymmetry.  Psychiatric:        Speech: Speech normal.        Behavior: Behavior normal.      ED Treatments / Results  Labs (all labs ordered are listed, but only abnormal results are displayed) Labs Reviewed  ETHANOL  PROTIME-INR  APTT  CBC  DIFFERENTIAL  COMPREHENSIVE METABOLIC PANEL  RAPID URINE DRUG SCREEN, HOSP PERFORMED  URINALYSIS, ROUTINE W REFLEX MICROSCOPIC  I-STAT CHEM 8, ED    EKG EKG Interpretation  Date/Time:  Thursday September 23 2018 10:29:43 EDT Ventricular Rate:  68 PR Interval:    QRS Duration: 83 QT Interval:  405 QTC Calculation: 431 R Axis:   -7 Text Interpretation:  Sinus rhythm Atrial premature complex Low voltage, precordial leads No old tracing to compare Confirmed by Lorre NickAllen, Ishmel Acevedo ([id number]) on 09/23/2018 10:47:23 AM Also confirmed by Lorre NickAllen, Jhoel Stieg ([id number]), editor Barbette Hairassel,  Kerry [phone]  on 09/23/2018 12:18:03 PM   Radiology No results found.  Procedures Procedures (including critical care time)  Medications Ordered in ED Medications - No data to display   Initial Impression / Assessment and Plan / ED Course  I have reviewed the triage vital signs and the nursing notes.  Pertinent labs & imaging results that were available during my  care of the patient were reviewed by me and considered in my medical decision making (see chart for details).        Patient with symptoms concerning for CVA.  Head CT negative at this time.  Case discussed with neurology who has seen the patient and feels that she probably did have a stroke.  Will see her in consultation and recommend hospitalist admission. Final Clinical Impressions(s) / ED Diagnoses   Final diagnoses:  None    ED Discharge Orders    None       Lacretia Leigh, MD 09/23/18 1455

## 2018-09-23 NOTE — ED Notes (Signed)
This RN called CT and informed them patient is ready for transport to CT scan.

## 2018-09-23 NOTE — Progress Notes (Signed)
Patient arrived to 3W13, daughter at bedside. A&Ox4. Hearing aids in bilaterally.  Notified by patient daughter, pt has metal plate in wrist. POC provided to patient. All belongings at bedside. Nurse will continue to monitor.

## 2018-09-23 NOTE — ED Notes (Signed)
Pt CBG was 127, notified Nikki(RN)

## 2018-09-23 NOTE — Consult Note (Addendum)
Neurology Consultation  Reason for Consult: Stroke  Referring Physician: Dr. Freida BusmanAllen  CC: Right-sided weakness and dysmetria  History is obtained from: Patient  HPI: Megan Livingston is a 85 y.o. female history of stroke hypertension depression.  Per patient she went to bed at 8:00 last night and upon waking this morning she noticed that she was having difficulty with her right hand.  She had significant difficulty putting her hearing aid in on the right side, due to problems with weakness or dexterity.  Later after the hearing aid was placed she was trying to take her pills and noticed that she was very dysmetric when trying to get the pill in her mouth.  It was for this reason that she was brought to the emergency department as she was afraid that she may have had a stroke.  She denies any speech issues, sensory issues, vision issues.  She does have fall issues secondary to spinal stenosis.  On an average daily basis she can walk with a walker, but does need a home aide to help her wash herself and also has a daughter who watches over her very carefully.  Of note patient does have dementia  LKW: 2000 hrs. on 09/22/2018 tpa given?: no, patient out of the window with minimal symptoms Premorbid modified Rankin scale (mRS): 4 NIH stroke scale of 1  ROS: A 14 point ROS was performed and is negative except as noted in the HPI.   Past Medical History:  Diagnosis Date  . Depression   . Hypertension   . Stroke [facility](HCC)     Family History  Problem Relation Age of Onset  . Hypertension Mother   . Hypertension Father     Social History:   reports that she has never smoked. She has never used smokeless tobacco. She reports that she does not drink alcohol. No history on file for drug.  Medications No current facility-administered medications for this encounter.   Current Outpatient Medications:  .  acetaminophen (TYLENOL) 325 MG tablet, Take 650 mg by mouth every 6 (six) hours as needed for  mild pain or headache., Disp: , Rfl:  .  aspirin 81 MG chewable tablet, Chew by mouth daily., Disp: , Rfl:  .  cholecalciferol (VITAMIN D3) 25 MCG (1000 UT) tablet, Take 1,000 Units by mouth daily., Disp: , Rfl:  .  docusate sodium (COLACE) 100 MG capsule, Take 100 mg by mouth as needed for mild constipation., Disp: , Rfl:  .  donepezil (ARICEPT) 10 MG tablet, Take 10 mg by mouth at bedtime., Disp: , Rfl:  .  DULoxetine (CYMBALTA) 60 MG capsule, Take 60 mg by mouth 2 (two) times daily. , Disp: , Rfl:  .  losartan (COZAAR) 100 MG tablet, Take 100 mg by mouth daily., Disp: , Rfl:  .  metoprolol tartrate (LOPRESSOR) 25 MG tablet, Take 12.5 mg by mouth 2 (two) times daily. , Disp: , Rfl:  .  omeprazole (PRILOSEC) 40 MG capsule, Take 40 mg by mouth daily. , Disp: , Rfl:  .  simvastatin (ZOCOR) 40 MG tablet, Take 60 mg by mouth daily at 6 PM. taking 1 & 1/2 tablet daily=60mg , Disp: , Rfl:  .  tolterodine (DETROL LA) 4 MG 24 hr capsule, Take 4 mg by mouth daily., Disp: , Rfl:  .  triamterene-hydrochlorothiazide (MAXZIDE-25) 37.5-25 MG tablet, Take 1 tablet by mouth daily., Disp: , Rfl:  .  albuterol (PROVENTIL HFA;VENTOLIN HFA) 108 (90 Base) MCG/ACT inhaler, Inhale 1-2 puffs into the lungs  every 6 (six) hours as needed for wheezing or shortness of breath. (Patient not taking: Reported on 09/23/2018), Disp: 1 Inhaler, Rfl: 0 .  azithromycin (ZITHROMAX) 250 MG tablet, Take 1 tablet (250 mg total) by mouth daily. Take first 2 tablets together, then 1 every day until finished. (Patient not taking: Reported on 09/23/2018), Disp: 6 tablet, Rfl: 0 .  budesonide-formoterol (SYMBICORT) 80-4.5 MCG/ACT inhaler, Inhale 2 puffs into the lungs 2 (two) times daily. (Patient not taking: Reported on 09/23/2018), Disp: 1 Inhaler, Rfl: 0 .  fluticasone (FLONASE) 50 MCG/ACT nasal spray, 1 or 2 sprays each nostril twice a day (Patient not taking: Reported on 09/23/2018), Disp: 16 g, Rfl: 0 .  predniSONE (DELTASONE) 20 MG tablet, 3 tabs  po day one, then 2 po daily x 4 days (Patient not taking: Reported on 09/23/2018), Disp: 11 tablet, Rfl: 0 .  Spacer/Aero-Holding Chambers (AEROCHAMBER PLUS WITH MASK) inhaler, Use as instructed (Patient not taking: Reported on 09/23/2018), Disp: 1 each, Rfl: 2   Exam: Current vital signs: BP (!) 178/89   Pulse 66   Temp 98.1 F (36.7 C) (Oral)   Resp 16   SpO2 99%  Vital signs in last 24 hours: Temp:  [98.1 F (36.7 C)] 98.1 F (36.7 C) (07/02 1030) Pulse Rate:  [64-70] 66 (07/02 1445) Resp:  [12-23] 16 (07/02 1445) BP: (143-184)/(68-115) 178/89 (07/02 1445) SpO2:  [97 %-100 %] 99 % (07/02 1445)  Physical Exam  Constitutional: Appears well-developed and well-nourished.  Psych: Affect appropriate to situation Eyes: No scleral injection HENT: No OP obstrucion Head: Normocephalic.  Cardiovascular: Normal rate and regular rhythm.  Respiratory: Effort normal, non-labored breathing GI: Soft.  No distension. There is no tenderness.  Skin: WDI  Neuro: Mental Status: Patient is awake, alert, oriented to person, place, month, year, and situation. Patient is able to give a clear and coherent history. No signs of aphasia or neglect Cranial Nerves: II: Visual Fields are full.  III,IV, VI: EOMI without ptosis or diploplia. Pupils equal, round and reactive to light V: Facial sensation is symmetric to temperature VII: Right facial droop VIII: hearing is intact to voice with hearing aids in X: Palate elevates symmetrically XI: Shoulder shrug is symmetric. XII: tongue is midline without atrophy or fasciculations.  Motor: Tone is normal. Bulk is normal. 5/5 strength was present on left side with upper extremity being 4/5 on the right and 4+/5 on the right lower Sensory: Sensation is symmetric to light touch and temperature in the arms and legs. Deep Tendon Reflexes: 2+ and symmetric in the biceps and patellae.  Plantars: Toes are downgoing bilaterally.  Cerebellar: FNF and HKS are  intact bilaterally however she does have a postural tremor with bilateral finger nose finger   Labs I have reviewed labs in epic and the results pertinent to this consultation are:   CBC    Component Value Date/Time   WBC 8.2 09/23/2018 1024   RBC 4.80 09/23/2018 1024   HGB 14.6 09/23/2018 1131   HCT 43.0 09/23/2018 1131   PLT 213 09/23/2018 1024   MCV 95.4 09/23/2018 1024   MCH 29.4 09/23/2018 1024   MCHC 30.8 09/23/2018 1024   RDW 13.6 09/23/2018 1024   LYMPHSABS 2.4 09/23/2018 1024   MONOABS 0.5 09/23/2018 1024   EOSABS 0.2 09/23/2018 1024   BASOSABS 0.1 09/23/2018 1024    CMP     Component Value Date/Time   NA 139 09/23/2018 1131   K 4.5 09/23/2018 1131   CL 106  09/23/2018 1131   CO2 26 09/23/2018 1024   GLUCOSE 123 (H) 09/23/2018 1131   BUN 25 (H) 09/23/2018 1131   CREATININE 1.10 (H) 09/23/2018 1131   CALCIUM 9.3 09/23/2018 1024   PROT 6.3 (L) 09/23/2018 1024   ALBUMIN 3.1 (L) 09/23/2018 1024   AST 22 09/23/2018 1024   ALT 17 09/23/2018 1024   ALKPHOS 99 09/23/2018 1024   BILITOT 0.6 09/23/2018 1024   GFRNONAA 45 (L) 09/23/2018 1024   GFRAA 52 (L) 09/23/2018 1024    Lipid Panel  No results found for: CHOL, TRIG, HDL, CHOLHDL, VLDL, LDLCALC, LDLDIRECT   Imaging I have reviewed the images obtained:  CT-scan of the brain-chronic atrophic and ischemic changes without acute abnormality  MRI examination of the brain-pending  Etta Quill PA-C [facility] [phone] 09/23/2018, 3:10 PM     Assessment: 85 year old female with history of dementia with new onset of RUE dysmetria and difficulty with fine motor movements.  Patient was brought to the hospital for these reasons and throughout the day her symptoms improved.  Given the right facial droop and weakness of the right arm without cortical findings she most likely has suffered a small vessel ischemic infarction.  Recommend # MRI of the brain without contrast # MRA Head and neck   #Transthoracic Echo, may need Holter monitor  #Continue ASA 325mg  daily, we may need to change to Plavix if patient did have stroke  #Start  Atorvastatin 80 mg/other high intensity statin # BP goal: permissive HTN upto 220/120 mmHg # HBAIC and Lipid profile # Telemetry monitoring # Frequent neuro checks # NPO until passes stroke swallow screen # Continue donepezil # please page stroke NP  Or  PA  Or MD from 8am -4 pm  as this patient from this time will be  followed by the stroke.   You can look them up on [email or web address]  Password [email or web address]  I have seen and examined the patient. 85 year old female with probable small vessel ischemic infarction on the left. Stroke work up as above. Continue ASA. May need to switch to Plavix if MRI reveals new stroke.  Electronically signed: Dr. Kerney Elbe

## 2018-09-24 ENCOUNTER — Inpatient Hospital Stay (HOSPITAL_COMMUNITY): Payer: Medicare Other

## 2018-09-24 ENCOUNTER — Encounter (HOSPITAL_COMMUNITY): Payer: Medicare Other

## 2018-09-24 DIAGNOSIS — G459 Transient cerebral ischemic attack, unspecified: Principal | ICD-10-CM

## 2018-09-24 DIAGNOSIS — I639 Cerebral infarction, unspecified: Secondary | ICD-10-CM

## 2018-09-24 DIAGNOSIS — I951 Orthostatic hypotension: Secondary | ICD-10-CM

## 2018-09-24 DIAGNOSIS — F329 Major depressive disorder, single episode, unspecified: Secondary | ICD-10-CM

## 2018-09-24 DIAGNOSIS — R8290 Unspecified abnormal findings in urine: Secondary | ICD-10-CM

## 2018-09-24 LAB — ECHOCARDIOGRAM COMPLETE
Height: 64 in
Weight: 3266.34 oz

## 2018-09-24 LAB — LIPID PANEL
Cholesterol: 130 mg/dL (ref 0–200)
HDL: 47 mg/dL (ref >40)
LDL Cholesterol: 55 mg/dL (ref 0–99)
Total CHOL/HDL Ratio: 2.8 RATIO
Triglycerides: 141 mg/dL (ref <150)
VLDL: 28 mg/dL (ref 0–40)

## 2018-09-24 LAB — NOVEL CORONAVIRUS, NAA (HOSP ORDER, SEND-OUT TO REF LAB; TAT 18-24 HRS): SARS-CoV-2, NAA: NOT DETECTED

## 2018-09-24 LAB — HEMOGLOBIN A1C
Hgb A1c MFr Bld: 6 % — ABNORMAL HIGH (ref 4.8–5.6)
Mean Plasma Glucose: 125.5 mg/dL

## 2018-09-24 MED ORDER — ASPIRIN EC 81 MG PO TBEC
81.0000 mg | DELAYED_RELEASE_TABLET | Freq: Every day | ORAL | Status: DC
  Administered 2018-09-24 – 2018-09-25 (×2): 81 mg via ORAL
  Filled 2018-09-24 (×2): qty 1

## 2018-09-24 MED ORDER — SIMVASTATIN 20 MG PO TABS
40.0000 mg | ORAL_TABLET | Freq: Every day | ORAL | Status: DC
  Administered 2018-09-24: 40 mg via ORAL
  Filled 2018-09-24: qty 2

## 2018-09-24 MED ORDER — CLOPIDOGREL BISULFATE 75 MG PO TABS
75.0000 mg | ORAL_TABLET | Freq: Every day | ORAL | Status: DC
  Administered 2018-09-24 – 2018-09-25 (×2): 75 mg via ORAL
  Filled 2018-09-24 (×2): qty 1

## 2018-09-24 MED ORDER — METOPROLOL TARTRATE 12.5 MG HALF TABLET
12.5000 mg | ORAL_TABLET | Freq: Two times a day (BID) | ORAL | Status: DC
  Administered 2018-09-24 – 2018-09-25 (×3): 12.5 mg via ORAL
  Filled 2018-09-24 (×3): qty 1

## 2018-09-24 NOTE — Progress Notes (Signed)
Bilateral lower extremity venous duplex completed. Preliminary results in Chart review CV proc. [facility], [place]  09/24/2018, 4:37 PM

## 2018-09-24 NOTE — Progress Notes (Signed)
SLP Cancellation Note  Patient Details Name: Megan Livingston MRN: [id number] DOB: [birth year]   Cancelled treatment:       Reason Eval/Treat Not Completed: Patient at procedure or test/unavailable(Pt with RN at this time. SLP will follow up. )  [name] I. Hardin Negus, [place], [place] Office number [phone] Pager [place] 09/24/2018, 9:49 AM

## 2018-09-24 NOTE — Evaluation (Signed)
Speech Language Pathology Evaluation Patient Details Name: Megan Livingston MRN: [id number] DOB: 09/03/1930 Today's Date: 09/24/2018 Time: 1000-1020 SLP Time Calculation (min) (ACUTE ONLY): 20 min  Problem List:  Patient Active Problem List   Diagnosis Date Noted  . CVA (cerebral vascular accident) ([place]) 09/23/2018  . HTN (hypertension) 09/23/2018  . HLD (hyperlipidemia) 09/23/2018  . Renal insufficiency 09/23/2018  . Depression 09/23/2018  . Abnormal urinalysis 09/23/2018   Past Medical History:  Past Medical History:  Diagnosis Date  . Depression   . Hypertension   . Stroke [facility])    Past Surgical History:  Past Surgical History:  Procedure Laterality Date  . BACK SURGERY    . CHOLECYSTECTOMY    . JOINT REPLACEMENT     HPI:  Pt is an 85 y.o. right-handed female with medical history significant of CVA, mild dementia, HTN, and depression who presented with complaints of right arm weakness. MRI of the brain was negative for acute abnormalities.    Assessment / Plan / Recommendation Clinical Impression  Pt reported that she is retired and currently lives with her daughter in [place] but also stays with her daughter in [place] a few times a year. Per the pt, she currently has 24-hour supervision from her daughters and that they manager her medications. She has a diagnosis of mild dementia and did report some baseline difficulty with memory. She denied any baseline deficits in speech or language as well as any new speech, language or cognitive deficits. Her speech and language skills are currently within normal limits. She demonstrated some mild difficulty with memory as she reported was her baseline but her cognitive skills appeared functional considering her level of support at home. Further skilled SLP services are not clinically indicated at this time. Pt, and nursing were educated regarding this and both parties verbalized understanding as well as agreement with plan of care.     SLP Assessment  SLP Recommendation/Assessment: Patient does not need any further Speech Lanaguage Pathology Services SLP Visit Diagnosis: Cognitive communication deficit (R41.841)    Follow Up Recommendations  None    Frequency and Duration           SLP Evaluation Cognition  Overall Cognitive Status: History of cognitive impairments - at baseline Arousal/Alertness: Awake/alert Orientation Level: Oriented to person;Oriented to place;Oriented to situation(Oriented to date, month, year not day: provided Thursday) Attention: Focused;Sustained Focused Attention: Appears intact Sustained Attention: Appears intact Memory: Impaired Memory Impairment: Retrieval deficit;Decreased recall of new information(Immediate: 3/3; delayed: 1/3; with cues: 2/2) Awareness: Appears intact Problem Solving: Appears intact(3/3) Executive Function: Sequencing Sequencing: Appears intact       Comprehension  Auditory Comprehension Overall Auditory Comprehension: Appears within functional limits for tasks assessed Yes/No Questions: Within Functional Limits Basic Biographical Questions: (5/5) Complex Questions: (4/5) Paragraph Comprehension (via yes/no questions): (4/4) Commands: Within Functional Limits Two Step Basic Commands: (4/4) Multistep Basic Commands: (3/4) Conversation: Complex Visual Recognition/Discrimination Discrimination: Within Function Limits Reading Comprehension Reading Status: Within funtional limits    Expression Expression Primary Mode of Expression: Verbal Verbal Expression Overall Verbal Expression: Appears within functional limits for tasks assessed Initiation: No impairment Automatic Speech: Counting;Day of week;Month of year(WNL) Repetition: Impaired Level of Impairment: Sentence level(3/5) Naming: No impairment Responsive: (5/5) Confrontation: Within functional limits(10/10) Convergent: (Sentence completion: 5/5) Pragmatics: No impairment Written  Expression Dominant Hand: Right   Oral / Motor  Oral Motor/Sensory Function Overall Oral Motor/Sensory Function: Within functional limits Motor Speech Overall Motor Speech: Appears within functional limits for tasks assessed Respiration:  Within functional limits Phonation: Normal Resonance: Within functional limits Articulation: Within functional limitis Intelligibility: Intelligible Motor Planning: Witnin functional limits Motor Speech Errors: Not applicable   Akia Montalban I. Vear ClockPhillips, MS, CCC-SLP Acute Rehabilitation Services Office number [phone] Pager [phone]                    Scheryl MartenShanika I Yamilet Mcfayden 09/24/2018, 10:27 AM

## 2018-09-24 NOTE — Evaluation (Signed)
Physical Therapy Evaluation Patient Details Name: Megan Livingston MRN: [id number] DOB: [birth year] Today's Date: 09/24/2018   History of Present Illness  Patient is an 85 year old female who presented to the hopsital with right UE wekaness. She has a prior history of CVA. She is from [place] but comes to [place] at times to stay with ehr daughter. Per OT the patient became hypotensive and symptomatic standing ealier. PMH stroke, HTN, depression   Clinical Impression  Patient presents with syncope upon standing. She had a similar event earlier in the day with OT. Her B/P dropped from 139/84 seated to 118/48 standing. She needed to sit down. She was put back into bed. Overall her mobility was good. Ambulation was not assessed. After session PT spoke with nursing. Per nursing the patient has had problems with orthostatics. Per daughter they work on standing up very slowly at home. Therapy will re-assess tomorrow and try to stand patient slower. Ambulation will need to be assessed but she will likely be able to go home with daughter. Acute PT will continue to follow.       Follow Up Recommendations Home health PT;Supervision/Assistance - 24 hour    Equipment Recommendations  None recommended by PT    Recommendations for Other Services       Precautions / Restrictions Precautions Precautions: Fall;Other (comment) Precaution Comments: watch orthostatics Restrictions Weight Bearing Restrictions: No      Mobility  Bed Mobility Overal bed mobility: Needs Assistance Bed Mobility: Supine to Sit     Supine to sit: Min assist     General bed mobility comments: min a to sit trunk up in bed baseline B/P 130/70 seated 139/84 aysmptomatic   Transfers Overall transfer level: Needs assistance Equipment used: Rolling walker (2 wheeled) Transfers: Sit to/from Stand Sit to Stand: Min guard         General transfer comment: after a few seconds patient became very syncopal. Patient could  not stand for blood pressure. Upon sitting her B/P was 118/48  Ambulation/Gait Ambulation/Gait assistance: (unable )              Stairs            Wheelchair Mobility    Modified Rankin (Stroke Patients Only)       Balance Overall balance assessment: Needs assistance Sitting-balance support: No upper extremity supported;Feet supported Sitting balance-Leahy Scale: Poor     Standing balance support: Bilateral upper extremity supported Standing balance-Leahy Scale: Poor Standing balance comment: became syncopal                              Pertinent Vitals/Pain Pain Assessment: No/denies pain    Home Living Family/patient expects to be discharged to:: Private residence Living Arrangements: Children Available Help at Discharge: Available 24 hours/day;Family Type of Home: House Home Access: Stairs to enter     Home Layout: One level Home Equipment: Environmental consultantWalker - 4 wheels;Shower seat      Prior Function Level of Independence: Needs assistance   Gait / Transfers Assistance Needed: using rollator- supervision level  ADL's / Homemaking Assistance Needed: Aid comes M-Fr 8-12 then SIL home the rest of the day        Hand Dominance   Dominant Hand: Right    Extremity/Trunk Assessment   Upper Extremity Assessment Upper Extremity Assessment: Defer to OT evaluation    Lower Extremity Assessment Lower Extremity Assessment: Overall WFL for tasks assessed  Cervical / Trunk Assessment Cervical / Trunk Assessment: Normal  Communication      Cognition Arousal/Alertness: Awake/alert Behavior During Therapy: WFL for tasks assessed/performed Overall Cognitive Status: History of cognitive impairments - at baseline                                 General Comments: was pleasent and followed all commands       General Comments General comments (skin integrity, edema, etc.): B/P seated 139/84 standing 118/48 no stroke symptoms noted in  the right LE     Exercises     Assessment/Plan    PT Assessment Patient needs continued PT services  PT Problem List Decreased strength;Decreased range of motion;Decreased activity tolerance;Decreased mobility       PT Treatment Interventions DME instruction;Gait training;Functional mobility training;Therapeutic activities;Therapeutic exercise;Neuromuscular re-education;Patient/family education    PT Goals (Current goals can be found in the Care Plan section)  Acute Rehab PT Goals Patient Stated Goal: to be less dizzy  PT Goal Formulation: With patient Time For Goal Achievement: 10/01/18 Potential to Achieve Goals: Good    Frequency Min 3X/week   Barriers to discharge        Co-evaluation               AM-PAC PT "6 Clicks" Mobility  Outcome Measure Help needed turning from your back to your side while in a flat bed without using bedrails?: A Little Help needed moving from lying on your back to sitting on the side of a flat bed without using bedrails?: A Little Help needed moving to and from a bed to a chair (including a wheelchair)?: A Little Help needed standing up from a chair using your arms (e.g., wheelchair or bedside chair)?: A Lot Help needed to walk in hospital room?: A Lot Help needed climbing 3-5 steps with a railing? : A Lot 6 Click Score: 15    End of Session Equipment Utilized During Treatment: Gait belt Activity Tolerance: Treatment limited secondary to medical complications (Comment)(orthostatic hyeprtension) Patient left: in bed;with call bell/phone within reach;with bed alarm set Nurse Communication: Mobility status PT Visit Diagnosis: Unsteadiness on feet (R26.81);Other abnormalities of gait and mobility (R26.89);Dizziness and giddiness (R42)    Time: 6761-9509 PT Time Calculation (min) (ACUTE ONLY): 28 min   Charges:   PT Evaluation $PT Eval Moderate Complexity: 1 Mod            Carney Living PT DPT  09/24/2018, 1:45 PM

## 2018-09-24 NOTE — TOC Initial Note (Signed)
Transition of Care [facility]) - Initial/Assessment Note    Patient Details  Name: Caera Enwright MRN: [id number] Date of Birth: [birth year]  [facility] of [facility]) CM/SW Contact:    Pollie Friar, RN Phone Number: 09/24/2018, 5:26 PM  Clinical Narrative:                 Plan is for patient to stay in [place] for about 30 more days. CM reached out to daughter, after speaking with the patient, about [facility] services. Daughter would like HH and CM provided choice and [facility]. [facility] is also going to set up a MD televisit with the patient during the 30 days.  CM also sent release of information form to Medical Records per request so her PcP in [place] will have the information from this hospitalization.  Expected Discharge Plan: [place] Barriers to Discharge: Continued Medical Work up   Patient Goals and CMS Choice   CMS Medicare.gov Compare Post Acute Care list provided to:: Patient Represenative (must comment) Choice offered to / list presented to : Adult Children  Expected Discharge Plan and Services Expected Discharge Plan: [place]   Discharge Planning Services: CM Consult Post Acute Care Choice: [place] arrangements for the past 2 months: [place] II: [facility] [place] Date [place]: 09/24/18   Representative spoke with at [place]: Dorian Pod  Prior Living Arrangements/Services Living arrangements for the past 2 months: Single Family Home Lives with:: Adult Children Patient language and need for interpreter reviewed:: Yes(no needs) Do you feel safe going back to the place where you live?: Yes      Need for Family Participation in Patient Care: Yes (Comment) Care giver support system in place?: Yes (comment)(has aides during day when family is at work)   Architect Involvement Pertinent to Current Situation/Hospitalization: No - Comment as  needed  Activities of Daily Living Home Assistive Devices/Equipment: Grab bars in shower, Hearing aid, Dentures (specify type), Walker (specify type) ADL Screening (condition at time of admission) Patient's cognitive ability adequate to safely complete daily activities?: Yes Is the patient deaf or have difficulty hearing?: Yes Does the patient have difficulty seeing, even when wearing glasses/contacts?: No Does the patient have difficulty concentrating, remembering, or making decisions?: No Patient able to express need for assistance with ADLs?: Yes Does the patient have difficulty dressing or bathing?: No Independently performs ADLs?: Yes (appropriate for developmental age) Does the patient have difficulty walking or climbing stairs?: Yes Weakness of Legs: None Weakness of Arms/Hands: None  Permission Sought/Granted                  Emotional Assessment Appearance:: Appears stated age Attitude/Demeanor/Rapport: Engaged Affect (typically observed): Pleasant, Guarded Orientation: : Oriented to Self, Oriented to Place, Oriented to  Time, Oriented to Situation   Psych Involvement: No (comment)  Admission diagnosis:  Weakness [R53.1] Patient Active Problem List   Diagnosis Date Noted  . CVA (cerebral vascular accident) ([place]) 09/23/2018  . HTN (hypertension) 09/23/2018  . HLD (hyperlipidemia) 09/23/2018  . Renal insufficiency 09/23/2018  . Depression 09/23/2018  . Abnormal urinalysis 09/23/2018   PCP:  System, Pcp Not In Pharmacy:   [place], [place] Percell Miller  [address] [place] [address] Phone: [phone] Fax: [phone]     Social Determinants of Health (SDOH) Interventions    Readmission Risk Interventions No flowsheet data found.

## 2018-09-24 NOTE — Consult Note (Signed)
ELECTROPHYSIOLOGY CONSULT NOTE  Patient ID: Megan Livingston Livingston, MRN: [id number], DOB/AGE: January 28, 1931 85 y.o. Admit date: 09/23/2018 Date of Consult: 09/24/2018  Primary Physician: System, Pcp Not In Primary Cardiologist: new Megan Livingston Burson is a 85 y.o. female who is being seen today for the evaluation of complex atrial ectopy in the setting of TIA.CVA* at the request of Dr Ashley RoyaltyMatthews.   Chief Complaint: stroke and ectopy   HPI Megan Livingston Rack is a 85 y.o. female admitted with RUE weakness and dysmetric.  MRI imaging demonstrated >>  No acute intracranial abnormality. Chronic small vessel ischemia, atrophy and old small vessel Infarcts.  CT was neg  Has been treated with ASA and moderate intensity statin   Has hx of prior stroke and hypertension.  Notes list dementia and depression; she acknowledges memory difficulty but she does know where she is.  Recalls catheterization, her daughter says 10 years ago.  No obstructive disease and normal LV function.  No history of atrial fibrillation.  Significant problems with orthostatic lightheadedness.  She has fallen a number of times according to her daughter but she herself does not remember.  These events have occurred mostly at night when that she gets up by herself to go to the bathroom.  Lightheadedness in the shower but she is showering with assistance with a chair.  Longstanding hypertension.  Telemetry >> frequent atrial ectopy with multiformed P waves Past Medical History:  Diagnosis Date   Depression    Hypertension    Stroke [facility](HCC)       Surgical History:  Past Surgical History:  Procedure Laterality Date   BACK SURGERY     CHOLECYSTECTOMY     JOINT REPLACEMENT       Home Meds: Prior to Admission medications   Medication Sig Start Date End Date Taking? Authorizing Provider  acetaminophen (TYLENOL) 325 MG tablet Take 650 mg by mouth every 6 (six) hours as needed for mild pain or headache.   Yes [provider]  aspirin 81 MG chewable tablet Chew by mouth daily.   Yes [provider]  cholecalciferol (VITAMIN D3) 25 MCG (1000 UT) tablet Take 1,000 Units by mouth daily.   Yes [provider]  docusate sodium (COLACE) 100 MG capsule Take 100 mg by mouth as needed for mild constipation.   Yes [provider]  donepezil (ARICEPT) 10 MG tablet Take 10 mg by mouth at bedtime.   Yes [provider]  DULoxetine (CYMBALTA) 60 MG capsule Take 60 mg by mouth 2 (two) times daily.    Yes [provider]  losartan (COZAAR) 100 MG tablet Take 100 mg by mouth daily.   Yes [provider]  metoprolol tartrate (LOPRESSOR) 25 MG tablet Take 12.5 mg by mouth 2 (two) times daily.    Yes [provider]  omeprazole (PRILOSEC) 40 MG capsule Take 40 mg by mouth daily.    Yes [provider]  simvastatin (ZOCOR) 40 MG tablet Take 60 mg by mouth daily at 6 PM. taking 1 & 1/2 tablet daily=60mg    Yes [provider]  tolterodine (DETROL LA) 4 MG 24 hr capsule Take 4 mg by mouth daily.   Yes [provider]  triamterene-hydrochlorothiazide (MAXZIDE-25) 37.5-25 MG tablet Take 1 tablet by mouth daily.   Yes [provider]  albuterol (PROVENTIL HFA;VENTOLIN HFA) 108 (90 Base) MCG/ACT inhaler Inhale 1-2 puffs into the lungs every 6 (six) hours as needed for wheezing or shortness of breath. Patient  not taking: Reported on 09/23/2018 03/18/16   Lurene Shadow, PA-C  azithromycin (ZITHROMAX) 250 MG tablet Take 1 tablet (250 mg total) by mouth daily. Take first 2 tablets together, then 1 every day until finished. Patient not taking: Reported on 09/23/2018 03/18/16   Lurene Shadow, PA-C  budesonide-formoterol [facility]) 80-4.5 MCG/ACT inhaler Inhale 2 puffs into the lungs 2 (two) times daily. Patient not taking: Reported on 09/23/2018 03/06/16   Lajean Manes, MD  fluticasone [facility]) 50 MCG/ACT nasal spray 1 or 2 sprays each  nostril twice a day Patient not taking: Reported on 09/23/2018 03/06/16   Lajean Manes, MD  predniSONE (DELTASONE) 20 MG tablet 3 tabs po day one, then 2 po daily x 4 days Patient not taking: Reported on 09/23/2018 03/18/16   Lurene Shadow, PA-C  Spacer/Aero-Holding Chambers (AEROCHAMBER PLUS WITH MASK) inhaler Use as instructed Patient not taking: Reported on 09/23/2018 03/18/16   Lurene Shadow, PA-C    Inpatient Medications:   aspirin EC  81 mg Oral Daily   clopidogrel  75 mg Oral Daily   donepezil  10 mg Oral QHS   DULoxetine  60 mg Oral BID   enoxaparin (LOVENOX) injection  40 mg Subcutaneous Q24H   fesoterodine  8 mg Oral Daily   metoprolol tartrate  12.5 mg Oral BID   pantoprazole  40 mg Oral Daily   simvastatin  40 mg Oral q1800      Allergies:  Allergies  Allergen Reactions   Oxycodone Other (See Comments)    hypotension    Social History   Socioeconomic History   Marital status: Single    Spouse name: Not on file   Number of children: Not on file   Years of education: Not on file   Highest education level: Not on file  Occupational History   Not on file  Social Needs   Financial resource strain: Not on file   Food insecurity    Worry: Not on file    Inability: Not on file   Transportation needs    Medical: Not on file    Non-medical: Not on file  Tobacco Use   Smoking status: Never Smoker   Smokeless tobacco: Never Used  Substance and Sexual Activity   Alcohol use: No   Drug use: Not on file   Sexual activity: Not on file  Lifestyle   Physical activity    Days per week: Not on file    Minutes per session: Not on file   Stress: Not on file  Relationships   Social connections    Talks on phone: Not on file    Gets together: Not on file    Attends religious service: Not on file    Active member of club or organization: Not on file    Attends meetings of clubs or organizations: Not on file    Relationship status: Not on file     Intimate partner violence    Fear of current or ex partner: Not on file    Emotionally abused: Not on file    Physically abused: Not on file    Forced sexual activity: Not on file  Other Topics Concern   Not on file  Social History Narrative   Not on file     Family History  Problem Relation Age of Onset   Hypertension Mother    Hypertension Father      ROS:  Please see the history of present illness.  All other systems reviewed and negative.    Physical Exam:  Blood pressure 130/62, pulse (!) 101, temperature 99 F (37.2 C), temperature source Axillary, resp. rate 20, height 5\' 4"  (1.626 m), weight 92.6 kg, SpO2 94 %. General: Well developed, well nourished female in no acute distress. Head: Normocephalic, atraumatic, sclera non-icteric, no xanthomas, nares are without discharge. EENT: normal Lymph Nodes:  none Back: without scoliosis/kyphosis , no CVA tendersness Neck: Negative for carotid bruits. JVD not elevated. Lungs: Clear bilaterally to auscultation without wheezes, rales, or rhonchi. Breathing is unlabored. Heart: irregular RR with S1 S2. 2/6 systolic  murmur , rubs, or gallops appreciated. Abdomen: Soft, non-tender, non-distended with normoactive bowel sounds. No hepatomegaly. No rebound/guarding. No obvious abdominal masses. Msk:  Strength and tone appear normal for age. Extremities: No clubbing or cyanosis. No edema.  Distal pedal pulses are 2+ and equal bilaterally. Skin: Warm and Dry Neuro: Alert and oriented X 3. CN III-XII intact Grossly normal sensory and motor function . Psych:  Responds to questions appropriately with a normal affect.      Labs: Cardiac Enzymes No results for input(s): CKTOTAL, CKMB, TROPONINI in the last 72 hours. CBC Lab Results  Component Value Date   WBC 8.2 09/23/2018   HGB 14.6 09/23/2018   HCT 43.0 09/23/2018   MCV 95.4 09/23/2018   PLT 213 09/23/2018   PROTIME: Recent Labs    09/23/18 1024  LABPROT 12.1   INR 0.9   Chemistry  Recent Labs  Lab 09/23/18 1024 09/23/18 1131  NA 141 139  K 4.6 4.5  CL 104 106  CO2 26  --   BUN 22 25*  CREATININE 1.11* 1.10*  CALCIUM 9.3  --   PROT 6.3*  --   BILITOT 0.6  --   ALKPHOS 99  --   ALT 17  --   AST 22  --   GLUCOSE 125* 123*   Lipids Lab Results  Component Value Date   CHOL 130 09/24/2018   HDL 47 09/24/2018   LDLCALC 55 09/24/2018   TRIG 141 09/24/2018   BNP No results found for: PROBNP Thyroid Function Tests: No results for input(s): TSH, T4TOTAL, T3FREE, THYROIDAB in the last 72 hours.  Invalid input(s): FREET3    Miscellaneous No results found for: DDIMER  Radiology/Studies:  Ct Head Wo Contrast  Result Date: 09/23/2018 CLINICAL DATA:  Right-sided weakness EXAM: CT HEAD WITHOUT CONTRAST TECHNIQUE: Contiguous axial images were obtained from the base of the skull through the vertex without intravenous contrast. COMPARISON:  None. FINDINGS: Brain: Chronic atrophic and white matter ischemic changes are noted. No findings to suggest acute hemorrhage, acute infarction or space-occupying mass lesion are noted. Vascular: No hyperdense vessel or unexpected calcification. Skull: Normal. Negative for fracture or focal lesion. Sinuses/Orbits: No acute finding. Other: None. IMPRESSION: Chronic atrophic and ischemic changes without acute abnormality. Electronically Signed   By: Inez Catalina M.D.   On: 09/23/2018 13:23   Mr Angio Head Wo Contrast  Result Date: 09/23/2018 CLINICAL DATA:  Right arm weakness EXAM: MR HEAD WITHOUT CONTRAST MR CIRCLE OF WILLIS WITHOUT CONTRAST MRA OF THE NECK WITHOUT AND WITH CONTRAST TECHNIQUE: Multiplanar, multiecho pulse sequences of the brain, circle of willis and surrounding structures were obtained without intravenous contrast. Angiographic images of the neck were obtained using MRA technique without and with intravenous contrast. CONTRAST:  8 mL Gadavist COMPARISON:  Head CT 09/23/2018 FINDINGS: MRI HEAD  FINDINGS BRAIN: There is no acute infarct, acute hemorrhage or extra-axial  collection. The midline structures are normal. There is no midline shift or mass effect. Diffuse confluent hyperintense T2-weighted signal within the periventricular, deep and juxtacortical white matter, most commonly due to chronic ischemic microangiopathy. There are multiple old small vessel infarcts of the basal ganglia and cerebellum. There is generalized atrophy without lobar predilection. There are 5-10 scattered microhemorrhages in a nonspecific pattern. VASCULAR: The major intracranial arterial and venous sinus flow voids are normal. SKULL AND UPPER CERVICAL SPINE: Calvarial bone marrow signal is normal. There is no skull base mass. The visualized upper cervical spine and soft tissues are normal. SINUSES/ORBITS: There are no fluid levels or advanced mucosal thickening. The mastoid air cells and middle ear cavities are free of fluid. The orbits are normal. MRA HEAD FINDINGS POSTERIOR CIRCULATION: --Vertebral arteries: Normal V4 segments. --Posterior inferior cerebellar arteries (PICA): Patent origins from the vertebral arteries. --Anterior inferior cerebellar arteries (AICA): Patent origins from the basilar artery. --Basilar artery: Normal. --Superior cerebellar arteries: Normal. --Posterior cerebral arteries (PCA): Severe stenosis at the left P1-2 junction. Both PCAs originate from the basilar artery. Posterior communicating arteries (p-comm) are diminutive or absent. ANTERIOR CIRCULATION: --Intracranial internal carotid arteries: Normal. --Anterior cerebral arteries (ACA): Normal. Both A1 segments are present. Patent anterior communicating artery (a-comm). --Middle cerebral arteries (MCA): Normal. MRA NECK FINDINGS Aortic arch: Normal 3 vessel aortic branching pattern. The visualized subclavian arteries are normal. Right carotid system: There is atherosclerotic plaque the right carotid bifurcation with qualitative narrowing of the  proximal right internal carotid artery that does not translate 2 hemodynamically significant stenosis by NASCET criteria. Left carotid system: Normal course and caliber without stenosis or evidence of dissection. Vertebral arteries: Left dominant. Vertebral artery origins are normal. Vertebral arteries are normal in course and caliber to the vertebrobasilar confluence without stenosis or evidence of dissection. IMPRESSION: 1. No acute intracranial abnormality. 2. Chronic small vessel ischemia, atrophy and old small vessel infarcts. 3. No emergent large vessel occlusion or high-grade stenosis. Moderate-to-severe stenosis of the left PCA P1-2 junction. 4. Atherosclerosis at the right carotid bifurcation without hemodynamically significant stenosis. Electronically Signed   By: Deatra RobinsonKevin  Herman M.D.   On: 09/23/2018 20:51   Mr Angio Neck W Wo Contrast  Result Date: 09/23/2018 CLINICAL DATA:  Right arm weakness EXAM: MR HEAD WITHOUT CONTRAST MR CIRCLE OF WILLIS WITHOUT CONTRAST MRA OF THE NECK WITHOUT AND WITH CONTRAST TECHNIQUE: Multiplanar, multiecho pulse sequences of the brain, circle of willis and surrounding structures were obtained without intravenous contrast. Angiographic images of the neck were obtained using MRA technique without and with intravenous contrast. CONTRAST:  8 mL Gadavist COMPARISON:  Head CT 09/23/2018 FINDINGS: MRI HEAD FINDINGS BRAIN: There is no acute infarct, acute hemorrhage or extra-axial collection. The midline structures are normal. There is no midline shift or mass effect. Diffuse confluent hyperintense T2-weighted signal within the periventricular, deep and juxtacortical white matter, most commonly due to chronic ischemic microangiopathy. There are multiple old small vessel infarcts of the basal ganglia and cerebellum. There is generalized atrophy without lobar predilection. There are 5-10 scattered microhemorrhages in a nonspecific pattern. VASCULAR: The major intracranial arterial and  venous sinus flow voids are normal. SKULL AND UPPER CERVICAL SPINE: Calvarial bone marrow signal is normal. There is no skull base mass. The visualized upper cervical spine and soft tissues are normal. SINUSES/ORBITS: There are no fluid levels or advanced mucosal thickening. The mastoid air cells and middle ear cavities are free of fluid. The orbits are normal. MRA HEAD FINDINGS POSTERIOR CIRCULATION: --Vertebral arteries:  Normal V4 segments. --Posterior inferior cerebellar arteries (PICA): Patent origins from the vertebral arteries. --Anterior inferior cerebellar arteries (AICA): Patent origins from the basilar artery. --Basilar artery: Normal. --Superior cerebellar arteries: Normal. --Posterior cerebral arteries (PCA): Severe stenosis at the left P1-2 junction. Both PCAs originate from the basilar artery. Posterior communicating arteries (p-comm) are diminutive or absent. ANTERIOR CIRCULATION: --Intracranial internal carotid arteries: Normal. --Anterior cerebral arteries (ACA): Normal. Both A1 segments are present. Patent anterior communicating artery (a-comm). --Middle cerebral arteries (MCA): Normal. MRA NECK FINDINGS Aortic arch: Normal 3 vessel aortic branching pattern. The visualized subclavian arteries are normal. Right carotid system: There is atherosclerotic plaque the right carotid bifurcation with qualitative narrowing of the proximal right internal carotid artery that does not translate 2 hemodynamically significant stenosis by NASCET criteria. Left carotid system: Normal course and caliber without stenosis or evidence of dissection. Vertebral arteries: Left dominant. Vertebral artery origins are normal. Vertebral arteries are normal in course and caliber to the vertebrobasilar confluence without stenosis or evidence of dissection. IMPRESSION: 1. No acute intracranial abnormality. 2. Chronic small vessel ischemia, atrophy and old small vessel infarcts. 3. No emergent large vessel occlusion or high-grade  stenosis. Moderate-to-severe stenosis of the left PCA P1-2 junction. 4. Atherosclerosis at the right carotid bifurcation without hemodynamically significant stenosis. Electronically Signed   By: Deatra RobinsonKevin  Herman M.D.   On: 09/23/2018 20:51   Mr Brain Wo Contrast  Result Date: 09/23/2018 CLINICAL DATA:  Right arm weakness EXAM: MR HEAD WITHOUT CONTRAST MR CIRCLE OF WILLIS WITHOUT CONTRAST MRA OF THE NECK WITHOUT AND WITH CONTRAST TECHNIQUE: Multiplanar, multiecho pulse sequences of the brain, circle of willis and surrounding structures were obtained without intravenous contrast. Angiographic images of the neck were obtained using MRA technique without and with intravenous contrast. CONTRAST:  8 mL Gadavist COMPARISON:  Head CT 09/23/2018 FINDINGS: MRI HEAD FINDINGS BRAIN: There is no acute infarct, acute hemorrhage or extra-axial collection. The midline structures are normal. There is no midline shift or mass effect. Diffuse confluent hyperintense T2-weighted signal within the periventricular, deep and juxtacortical white matter, most commonly due to chronic ischemic microangiopathy. There are multiple old small vessel infarcts of the basal ganglia and cerebellum. There is generalized atrophy without lobar predilection. There are 5-10 scattered microhemorrhages in a nonspecific pattern. VASCULAR: The major intracranial arterial and venous sinus flow voids are normal. SKULL AND UPPER CERVICAL SPINE: Calvarial bone marrow signal is normal. There is no skull base mass. The visualized upper cervical spine and soft tissues are normal. SINUSES/ORBITS: There are no fluid levels or advanced mucosal thickening. The mastoid air cells and middle ear cavities are free of fluid. The orbits are normal. MRA HEAD FINDINGS POSTERIOR CIRCULATION: --Vertebral arteries: Normal V4 segments. --Posterior inferior cerebellar arteries (PICA): Patent origins from the vertebral arteries. --Anterior inferior cerebellar arteries (AICA): Patent  origins from the basilar artery. --Basilar artery: Normal. --Superior cerebellar arteries: Normal. --Posterior cerebral arteries (PCA): Severe stenosis at the left P1-2 junction. Both PCAs originate from the basilar artery. Posterior communicating arteries (p-comm) are diminutive or absent. ANTERIOR CIRCULATION: --Intracranial internal carotid arteries: Normal. --Anterior cerebral arteries (ACA): Normal. Both A1 segments are present. Patent anterior communicating artery (a-comm). --Middle cerebral arteries (MCA): Normal. MRA NECK FINDINGS Aortic arch: Normal 3 vessel aortic branching pattern. The visualized subclavian arteries are normal. Right carotid system: There is atherosclerotic plaque the right carotid bifurcation with qualitative narrowing of the proximal right internal carotid artery that does not translate 2 hemodynamically significant stenosis by NASCET criteria. Left carotid system:  Normal course and caliber without stenosis or evidence of dissection. Vertebral arteries: Left dominant. Vertebral artery origins are normal. Vertebral arteries are normal in course and caliber to the vertebrobasilar confluence without stenosis or evidence of dissection. IMPRESSION: 1. No acute intracranial abnormality. 2. Chronic small vessel ischemia, atrophy and old small vessel infarcts. 3. No emergent large vessel occlusion or high-grade stenosis. Moderate-to-severe stenosis of the left PCA P1-2 junction. 4. Atherosclerosis at the right carotid bifurcation without hemodynamically significant stenosis. Electronically Signed   By: Deatra Robinson M.D.   On: 09/23/2018 20:51    EKG: sinus w freq atrial ectopy   Assessment and Plan:  Stroke-multiple recurrent  Atrial ectopy-frequent multifocal  Orthostatic hypotension  Hypertension    We will need neurology input as to their thoughts as to whether this likely represents atherosclerotic disease or thromboembolism.  My interpretation of imaging is that it is the  former.  If that is the case would consider alternative antiplatelet therapy; I just spoke with Dr. Ashley Royalty and this is already been recommended.  Furthermore, would recommend up titration of her statins to be more in accordance with the SPARCL trial i.e. atorvastatin 40--80  In addition, her orthostatic hypotension is quite profound.  She has had, by her daughter's history, recurrent falls at night.  Discussed nonpharmacological interventions to include raising the head of the bed 4-6 inches, this is not with pillows but putting a new angle in the bed, i.e. reverse Trendelenburg, and using an abdominal binder at night when the patient does not have reminders to be careful.  Pharmacological therapy could include pyridostigmine which does not tend to aggravate supine systolic hypertension  She has symptoms of constipation dry eyes and dry mouth raising the possibility of systemic autonomic dysfunction.  We will be glad to see again as needed  Have spoken with her daughter and Dr. Ashley Royalty he does decline:     Sherryl Manges

## 2018-09-24 NOTE — Progress Notes (Signed)
PROGRESS NOTE    Megan Livingston  WUJ:[id number] DOB: 1930-11-26 DOA: 09/23/2018 PCP: System, Pcp Not In    Brief Narrative:85 y.o. right-handed female with medical history significant of CVA, HTN, and depression; who presents admitted with complaints of right arm weakness which started this morning upon waking up around 8 AM.  Last noted to be normal prior to going to sleep last night around 8:30 PM.  At baseline patient normally ambulates with use of a walker and has nursing aides to help watch after her during the day.  Upon waking up she normally puts in her hearing aid but was having difficulty getting them into her ear and needed the aids help.  Upon standing she reported feeling dizzy as though the room were spinning around her.  Denied having any focal weakness in her legs and did not fall.  She tried to eat breakfast and take her morning pills.  However, noted that she had difficulty looking up the pills and dropped some and she kept missing her mouth while trying to feed herself.  Denies having any headache, change in vision, cough, shortness of breath, chest pain, palpitations, fever, abdominal pain, nausea, vomiting, or diarrhea.  ED Course: Upon admission to the emergency department patient was noted to be afebrile with blood pressures elevated up to 184/68, and all other vital signs relatively within normal limits.  Patient was seen as a code stroke, but did not meet criteria for TPA.  CT scan of the brain noted chronic atrophy and ischemic changes, but no acute abnormality.  Labs revealed BUN 25 and creatinine 1.1.  Urinalysis was positive for signs of infection.  TRH was called to admit to complete stroke work-up.  Assessment & Plan:   Principal Problem:   CVA (cerebral vascular accident) (HCC) Active Problems:   HTN (hypertension)   HLD (hyperlipidemia)   Renal insufficiency   Depression   Abnormal urinalysis  #1 TIA versus  CVA patient admitted with right upper extremity  weakness unable to hold small objects like hearing aid holding a spoon feeding herself which she is totally new for her.  She continues to have difficulty with using her right upper extremity.  Stroke work-up still in progress.  PT eval pending OT eval pending Speech eval noted   LDL 55 Hemoglobin A1c 6.0  Echocardiogram pending  Vascular ultrasound of the Doppler lower extremities pending  Neurology recommends aspirin and Plavix for 3 weeks and then Plavix alone.   Work-up so far shows- MRI MRA No acute intracranial abnormality. Chronic small vessel ischemia, atrophy and old small vessel infarcts.  No emergent large vessel occlusion or high-grade stenosis. Moderate-to-severe stenosis of the left PCA P1-2 junction. Atherosclerosis at the right carotid bifurcation without hemodynamically significant stenosis.   #2 hypertension restart metoprolol patient is on and off tachycardic and hypertensive.  She is on multiple medications at home which includes Cozaar 100 mg daily, Maxide 37.5 x 25 mg daily, metoprolol 12.5 mg twice a day.  PRN hydralazine  #3 mild dementia on Aricept  #4 depression continue Cymbalta  #5 hyperlipidemia on Zocor at home 60 mg at bedtime continue.  LDL is 55.  #6 abnormal UA UA appears dirty with positive nitrites and leukocytes.  Await urine culture.  Patient is asymptomatic.  #7 AKI patient received gentle IV fluids overnight creatinine slowly improving   #8 GERD continue PPI  #9 frequent PACs discussed with Dr. Graciela HusbandsKlein .Dr. Graciela HusbandsKlein to see the patient today.  Follow-up echo  DVT prophylaxis: lovenox Code Status: Full Family Communication:  Discussed with patient's daughter Disposition Plan: Pending further stroke work-up and cardiology consult Consults called: Neurology and cardiology  Estimated body mass index is 35.04 kg/m as calculated from the following:   Height as of this encounter: 5\' 4"  (1.626 m).   Weight as of this encounter: 92.6  kg.    Subjective:  Patient resting in bed still complaining of right upper extremity weakness difficulty use could not eat breakfast could not hold a spoon eat applesauce by herself Objective: Vitals:   09/24/18 0008 09/24/18 0402 09/24/18 0738 09/24/18 1226  BP: (!) 151/58 (!) 163/82 (!) 162/64 130/62  Pulse: 81 83 87 (!) 101  Resp: 14 (!) 22 13 20   Temp: 98 F (36.7 C) 98.1 F (36.7 C) 98 F (36.7 C) 99 F (37.2 C)  TempSrc: Oral Oral Oral Axillary  SpO2: 94% 100% 93% 94%  Weight:      Height:        Intake/Output Summary (Last 24 hours) at 09/24/2018 1324 Last data filed at 09/23/2018 2103 Gross per 24 hour  Intake 240 ml  Output --  Net 240 ml   Filed Weights   09/23/18 1824  Weight: 92.6 kg    Examination:  General exam: Appears calm and comfortable  Respiratory system: Clear to auscultation. Respiratory effort normal. Cardiovascular system: S1 & S2 heard, RRR. No JVD, murmurs, rubs, gallops or clicks. No pedal edema. Gastrointestinal system: Abdomen is nondistended, soft and nontender. No organomegaly or masses felt. Normal bowel sounds heard. Central nervous system: Alert and oriented. MILD RUE WEAKNESS Extremities: Symmetric 5 x 5 power. Skin: No rashes, lesions or ulcers Psychiatry: Judgement and insight appear normal. Mood & affect appropriate.     Data Reviewed: I have personally reviewed following labs and imaging studies  CBC: Recent Labs  Lab 09/23/18 1024 09/23/18 1131  WBC 8.2  --   NEUTROABS 5.0  --   HGB 14.1 14.6  HCT 45.8 43.0  MCV 95.4  --   PLT 213  --    Basic Metabolic Panel: Recent Labs  Lab 09/23/18 1024 09/23/18 1131  NA 141 139  K 4.6 4.5  CL 104 106  CO2 26  --   GLUCOSE 125* 123*  BUN 22 25*  CREATININE 1.11* 1.10*  CALCIUM 9.3  --    GFR: Estimated Creatinine Clearance: 41.3 mL/min (A) (by C-G formula based on SCr of 1.1 mg/dL (H)). Liver Function Tests: Recent Labs  Lab 09/23/18 1024  AST 22  ALT 17   ALKPHOS 99  BILITOT 0.6  PROT 6.3*  ALBUMIN 3.1*   No results for input(s): LIPASE, AMYLASE in the last 168 hours. No results for input(s): AMMONIA in the last 168 hours. Coagulation Profile: Recent Labs  Lab 09/23/18 1024  INR 0.9   Cardiac Enzymes: No results for input(s): CKTOTAL, CKMB, CKMBINDEX, TROPONINI in the last 168 hours. BNP (last 3 results) No results for input(s): PROBNP in the last 8760 hours. HbA1C: Recent Labs    09/24/18 0434  HGBA1C 6.0*   CBG: Recent Labs  Lab 09/23/18 1055  GLUCAP 127*   Lipid Profile: Recent Labs    09/24/18 0434  CHOL 130  HDL 47  LDLCALC 55  TRIG 141  CHOLHDL 2.8   Thyroid Function Tests: No results for input(s): TSH, T4TOTAL, FREET4, T3FREE, THYROIDAB in the last 72 hours. Anemia Panel: No results for input(s): VITAMINB12, FOLATE, FERRITIN, TIBC, IRON, RETICCTPCT in the last 72  hours. Sepsis Labs: No results for input(s): PROCALCITON, LATICACIDVEN in the last 168 hours.  No results found for this or any previous visit (from the past 240 hour(s)).       Radiology Studies: Ct Head Wo Contrast  Result Date: 09/23/2018 CLINICAL DATA:  Right-sided weakness EXAM: CT HEAD WITHOUT CONTRAST TECHNIQUE: Contiguous axial images were obtained from the base of the skull through the vertex without intravenous contrast. COMPARISON:  None. FINDINGS: Brain: Chronic atrophic and white matter ischemic changes are noted. No findings to suggest acute hemorrhage, acute infarction or space-occupying mass lesion are noted. Vascular: No hyperdense vessel or unexpected calcification. Skull: Normal. Negative for fracture or focal lesion. Sinuses/Orbits: No acute finding. Other: None. IMPRESSION: Chronic atrophic and ischemic changes without acute abnormality. Electronically Signed   By: Alcide CleverMark  Lukens M.D.   On: 09/23/2018 13:23   Mr Angio Head Wo Contrast  Result Date: 09/23/2018 CLINICAL DATA:  Right arm weakness EXAM: MR HEAD WITHOUT CONTRAST  MR CIRCLE OF WILLIS WITHOUT CONTRAST MRA OF THE NECK WITHOUT AND WITH CONTRAST TECHNIQUE: Multiplanar, multiecho pulse sequences of the brain, circle of willis and surrounding structures were obtained without intravenous contrast. Angiographic images of the neck were obtained using MRA technique without and with intravenous contrast. CONTRAST:  8 mL Gadavist COMPARISON:  Head CT 09/23/2018 FINDINGS: MRI HEAD FINDINGS BRAIN: There is no acute infarct, acute hemorrhage or extra-axial collection. The midline structures are normal. There is no midline shift or mass effect. Diffuse confluent hyperintense T2-weighted signal within the periventricular, deep and juxtacortical white matter, most commonly due to chronic ischemic microangiopathy. There are multiple old small vessel infarcts of the basal ganglia and cerebellum. There is generalized atrophy without lobar predilection. There are 5-10 scattered microhemorrhages in a nonspecific pattern. VASCULAR: The major intracranial arterial and venous sinus flow voids are normal. SKULL AND UPPER CERVICAL SPINE: Calvarial bone marrow signal is normal. There is no skull base mass. The visualized upper cervical spine and soft tissues are normal. SINUSES/ORBITS: There are no fluid levels or advanced mucosal thickening. The mastoid air cells and middle ear cavities are free of fluid. The orbits are normal. MRA HEAD FINDINGS POSTERIOR CIRCULATION: --Vertebral arteries: Normal V4 segments. --Posterior inferior cerebellar arteries (PICA): Patent origins from the vertebral arteries. --Anterior inferior cerebellar arteries (AICA): Patent origins from the basilar artery. --Basilar artery: Normal. --Superior cerebellar arteries: Normal. --Posterior cerebral arteries (PCA): Severe stenosis at the left P1-2 junction. Both PCAs originate from the basilar artery. Posterior communicating arteries (p-comm) are diminutive or absent. ANTERIOR CIRCULATION: --Intracranial internal carotid arteries:  Normal. --Anterior cerebral arteries (ACA): Normal. Both A1 segments are present. Patent anterior communicating artery (a-comm). --Middle cerebral arteries (MCA): Normal. MRA NECK FINDINGS Aortic arch: Normal 3 vessel aortic branching pattern. The visualized subclavian arteries are normal. Right carotid system: There is atherosclerotic plaque the right carotid bifurcation with qualitative narrowing of the proximal right internal carotid artery that does not translate 2 hemodynamically significant stenosis by NASCET criteria. Left carotid system: Normal course and caliber without stenosis or evidence of dissection. Vertebral arteries: Left dominant. Vertebral artery origins are normal. Vertebral arteries are normal in course and caliber to the vertebrobasilar confluence without stenosis or evidence of dissection. IMPRESSION: 1. No acute intracranial abnormality. 2. Chronic small vessel ischemia, atrophy and old small vessel infarcts. 3. No emergent large vessel occlusion or high-grade stenosis. Moderate-to-severe stenosis of the left PCA P1-2 junction. 4. Atherosclerosis at the right carotid bifurcation without hemodynamically significant stenosis. Electronically Signed   By:  Ulyses Jarred M.D.   On: 09/23/2018 20:51   Mr Angio Neck W Wo Contrast  Result Date: 09/23/2018 CLINICAL DATA:  Right arm weakness EXAM: MR HEAD WITHOUT CONTRAST MR CIRCLE OF WILLIS WITHOUT CONTRAST MRA OF THE NECK WITHOUT AND WITH CONTRAST TECHNIQUE: Multiplanar, multiecho pulse sequences of the brain, circle of willis and surrounding structures were obtained without intravenous contrast. Angiographic images of the neck were obtained using MRA technique without and with intravenous contrast. CONTRAST:  8 mL Gadavist COMPARISON:  Head CT 09/23/2018 FINDINGS: MRI HEAD FINDINGS BRAIN: There is no acute infarct, acute hemorrhage or extra-axial collection. The midline structures are normal. There is no midline shift or mass effect. Diffuse  confluent hyperintense T2-weighted signal within the periventricular, deep and juxtacortical white matter, most commonly due to chronic ischemic microangiopathy. There are multiple old small vessel infarcts of the basal ganglia and cerebellum. There is generalized atrophy without lobar predilection. There are 5-10 scattered microhemorrhages in a nonspecific pattern. VASCULAR: The major intracranial arterial and venous sinus flow voids are normal. SKULL AND UPPER CERVICAL SPINE: Calvarial bone marrow signal is normal. There is no skull base mass. The visualized upper cervical spine and soft tissues are normal. SINUSES/ORBITS: There are no fluid levels or advanced mucosal thickening. The mastoid air cells and middle ear cavities are free of fluid. The orbits are normal. MRA HEAD FINDINGS POSTERIOR CIRCULATION: --Vertebral arteries: Normal V4 segments. --Posterior inferior cerebellar arteries (PICA): Patent origins from the vertebral arteries. --Anterior inferior cerebellar arteries (AICA): Patent origins from the basilar artery. --Basilar artery: Normal. --Superior cerebellar arteries: Normal. --Posterior cerebral arteries (PCA): Severe stenosis at the left P1-2 junction. Both PCAs originate from the basilar artery. Posterior communicating arteries (p-comm) are diminutive or absent. ANTERIOR CIRCULATION: --Intracranial internal carotid arteries: Normal. --Anterior cerebral arteries (ACA): Normal. Both A1 segments are present. Patent anterior communicating artery (a-comm). --Middle cerebral arteries (MCA): Normal. MRA NECK FINDINGS Aortic arch: Normal 3 vessel aortic branching pattern. The visualized subclavian arteries are normal. Right carotid system: There is atherosclerotic plaque the right carotid bifurcation with qualitative narrowing of the proximal right internal carotid artery that does not translate 2 hemodynamically significant stenosis by NASCET criteria. Left carotid system: Normal course and caliber  without stenosis or evidence of dissection. Vertebral arteries: Left dominant. Vertebral artery origins are normal. Vertebral arteries are normal in course and caliber to the vertebrobasilar confluence without stenosis or evidence of dissection. IMPRESSION: 1. No acute intracranial abnormality. 2. Chronic small vessel ischemia, atrophy and old small vessel infarcts. 3. No emergent large vessel occlusion or high-grade stenosis. Moderate-to-severe stenosis of the left PCA P1-2 junction. 4. Atherosclerosis at the right carotid bifurcation without hemodynamically significant stenosis. Electronically Signed   By: Ulyses Jarred M.D.   On: 09/23/2018 20:51   Mr Brain Wo Contrast  Result Date: 09/23/2018 CLINICAL DATA:  Right arm weakness EXAM: MR HEAD WITHOUT CONTRAST MR CIRCLE OF WILLIS WITHOUT CONTRAST MRA OF THE NECK WITHOUT AND WITH CONTRAST TECHNIQUE: Multiplanar, multiecho pulse sequences of the brain, circle of willis and surrounding structures were obtained without intravenous contrast. Angiographic images of the neck were obtained using MRA technique without and with intravenous contrast. CONTRAST:  8 mL Gadavist COMPARISON:  Head CT 09/23/2018 FINDINGS: MRI HEAD FINDINGS BRAIN: There is no acute infarct, acute hemorrhage or extra-axial collection. The midline structures are normal. There is no midline shift or mass effect. Diffuse confluent hyperintense T2-weighted signal within the periventricular, deep and juxtacortical white matter, most commonly due to chronic ischemic  microangiopathy. There are multiple old small vessel infarcts of the basal ganglia and cerebellum. There is generalized atrophy without lobar predilection. There are 5-10 scattered microhemorrhages in a nonspecific pattern. VASCULAR: The major intracranial arterial and venous sinus flow voids are normal. SKULL AND UPPER CERVICAL SPINE: Calvarial bone marrow signal is normal. There is no skull base mass. The visualized upper cervical spine  and soft tissues are normal. SINUSES/ORBITS: There are no fluid levels or advanced mucosal thickening. The mastoid air cells and middle ear cavities are free of fluid. The orbits are normal. MRA HEAD FINDINGS POSTERIOR CIRCULATION: --Vertebral arteries: Normal V4 segments. --Posterior inferior cerebellar arteries (PICA): Patent origins from the vertebral arteries. --Anterior inferior cerebellar arteries (AICA): Patent origins from the basilar artery. --Basilar artery: Normal. --Superior cerebellar arteries: Normal. --Posterior cerebral arteries (PCA): Severe stenosis at the left P1-2 junction. Both PCAs originate from the basilar artery. Posterior communicating arteries (p-comm) are diminutive or absent. ANTERIOR CIRCULATION: --Intracranial internal carotid arteries: Normal. --Anterior cerebral arteries (ACA): Normal. Both A1 segments are present. Patent anterior communicating artery (a-comm). --Middle cerebral arteries (MCA): Normal. MRA NECK FINDINGS Aortic arch: Normal 3 vessel aortic branching pattern. The visualized subclavian arteries are normal. Right carotid system: There is atherosclerotic plaque the right carotid bifurcation with qualitative narrowing of the proximal right internal carotid artery that does not translate 2 hemodynamically significant stenosis by NASCET criteria. Left carotid system: Normal course and caliber without stenosis or evidence of dissection. Vertebral arteries: Left dominant. Vertebral artery origins are normal. Vertebral arteries are normal in course and caliber to the vertebrobasilar confluence without stenosis or evidence of dissection. IMPRESSION: 1. No acute intracranial abnormality. 2. Chronic small vessel ischemia, atrophy and old small vessel infarcts. 3. No emergent large vessel occlusion or high-grade stenosis. Moderate-to-severe stenosis of the left PCA P1-2 junction. 4. Atherosclerosis at the right carotid bifurcation without hemodynamically significant stenosis.  Electronically Signed   By: Deatra Robinson M.D.   On: 09/23/2018 20:51        Scheduled Meds:  aspirin EC  81 mg Oral Daily   clopidogrel  75 mg Oral Daily   donepezil  10 mg Oral QHS   DULoxetine  60 mg Oral BID   enoxaparin (LOVENOX) injection  40 mg Subcutaneous Q24H   fesoterodine  8 mg Oral Daily   pantoprazole  40 mg Oral Daily   simvastatin  40 mg Oral q1800   Continuous Infusions:   LOS: 1 day        Alwyn Ren, MD [facility]  If 7PM-7AM, please contact night-coverage [email or web address] Password [email or web address] 09/24/2018, 1:24 PM

## 2018-09-24 NOTE — Evaluation (Signed)
Occupational Therapy Evaluation Patient Details Name: Megan Livingston MRN: [id number] DOB: 07-29-1930 Today's Date: 09/24/2018    History of Present Illness Patient is an 85 year old female who presented to the hopsital with right UE wekaness. She has a prior history of CVA. She is from [place] but comes to [place] at times to stay with ehr daughter. Per OT the patient became hypotensive and symptomatic standing ealier. PMH stroke, HTN, depression    Clinical Impression   Pt PTA: 4 hour aid daily M-Fr to assist with ADL; lives with family for 24/7 care. Pt currently performing bed mobility with minA and limited to sit to stand x2 times due to + orthostatics with symptoms. Pt performing set-upA for grooming at EOB, but increased dizziness requiring assist. Otherwise, pt requiring assist for LB ADL in sitting and poor activity tolerance. Pt would benefit from continued OT skilled services for ADL,mobiltiy and safety in [facility] setting. OT following acutely.  BP: + orthostatic: lying 157/59, sitting 150/74; standing 85/44. Pt returned to bed and VSS.      Follow Up Recommendations  Home health OT;Supervision/Assistance - 24 hour    Equipment Recommendations  None recommended by OT    Recommendations for Other Services       Precautions / Restrictions Precautions Precautions: Fall;Other (comment) Precaution Comments: watch orthostatics Restrictions Weight Bearing Restrictions: No      Mobility Bed Mobility Overal bed mobility: Needs Assistance Bed Mobility: Supine to Sit     Supine to sit: Min assist     General bed mobility comments: minA for trunk elevation and guiding BLEs toward EOB  Transfers Overall transfer level: Needs assistance Equipment used: Rolling walker (2 wheeled) Transfers: Sit to/from Stand Sit to Stand: Min guard         General transfer comment: Pt standing BP: 85/44 and immediately sat down after BP. Pt swaying a little due to dizziness in  standing.    Balance Overall balance assessment: Needs assistance Sitting-balance support: No upper extremity supported;Feet supported Sitting balance-Leahy Scale: Poor     Standing balance support: Bilateral upper extremity supported Standing balance-Leahy Scale: Poor Standing balance comment: became syncopal                            ADL either performed or assessed with clinical judgement   ADL Overall ADL's : At baseline                                       General ADL Comments: Pt requires assist with bathing/dressing, but set-upA for feeding/grooming in sitting. Pt unable to stand for ADL due to dizziness + orthostatics.     Vision Baseline Vision/History: Wears glasses Wears Glasses: At all times Vision Assessment?: No apparent visual deficits     Perception     Praxis      Pertinent Vitals/Pain Pain Assessment: No/denies pain     Hand Dominance Right   Extremity/Trunk Assessment Upper Extremity Assessment Upper Extremity Assessment: Generalized weakness RUE Deficits / Details: 3-/5 MM grade in shoulder through digits RUE Coordination: decreased fine motor;decreased gross motor LUE Deficits / Details: L side residual weakness from previous CVA 2/5 MM grade LUE Coordination: decreased fine motor;decreased gross motor   Lower Extremity Assessment Lower Extremity Assessment: Defer to PT evaluation   Cervical / Trunk Assessment Cervical / Trunk Assessment: Normal  Communication Communication Communication: Other (comment)(slow to initiate)   Cognition Arousal/Alertness: Awake/alert Behavior During Therapy: WFL for tasks assessed/performed Overall Cognitive Status: History of cognitive impairments - at baseline                                 General Comments: followed all commands   General Comments  BP: + orthostatic: lying 157/59, sitting 150/74; standing 85/44. Pt returned to bed and VSS.    Exercises      Shoulder Instructions      Home Living Family/patient expects to be discharged to:: Private residence Living Arrangements: Children Available Help at Discharge: Available 24 hours/day Type of Home: House Home Access: Stairs to enter     Home Layout: One level     Bathroom Shower/Tub: Chief Strategy OfficerTub/shower unit   Bathroom Toilet: Standard     Home Equipment: Environmental consultantWalker - 4 wheels;Shower seat      Lives With: Daughter    Prior Functioning/Environment Level of Independence: Needs assistance  Gait / Transfers Assistance Needed: using rollator- supervision level ADL's / Homemaking Assistance Needed: Aid comes M-Fr 8-12 then SIL home the rest of the day            OT Problem List: Decreased strength;Decreased activity tolerance;Impaired balance (sitting and/or standing);Decreased safety awareness      OT Treatment/Interventions: Self-care/ADL training;Neuromuscular education;Energy conservation;DME and/or AE instruction;Therapeutic activities;Visual/perceptual remediation/compensation;Patient/family education;Balance training    OT Goals(Current goals can be found in the care plan section) Acute Rehab OT Goals Patient Stated Goal: to be less dizzy  OT Goal Formulation: With patient Time For Goal Achievement: 10/08/18 Potential to Achieve Goals: Good ADL Goals Pt Will Perform Lower Body Dressing: with set-up;with adaptive equipment;sitting/lateral leans;sit to/from stand Pt Will Perform Toileting - Clothing Manipulation and hygiene: with min assist;sitting/lateral leans;sit to/from stand Additional ADL Goal #1: Pt will stand x5 mins at sink  with fair balance for S for OOB ADL  OT Frequency: Min 2X/week   Barriers to D/C:            Co-evaluation              AM-PAC OT "6 Clicks" Daily Activity     Outcome Measure Help from another person eating meals?: None Help from another person taking care of personal grooming?: A Little Help from another person toileting, which  includes using toliet, bedpan, or urinal?: A Lot Help from another person bathing (including washing, rinsing, drying)?: A Lot Help from another person to put on and taking off regular upper body clothing?: A Little Help from another person to put on and taking off regular lower body clothing?: A Lot 6 Click Score: 16   End of Session Equipment Utilized During Treatment: Gait belt;Rolling walker Nurse Communication: Mobility status;Other (comment)(+ orthostatics.)  Activity Tolerance: Patient limited by fatigue;Treatment limited secondary to medical complications (Comment) Patient left: in bed;with call bell/phone within reach;with bed alarm set  OT Visit Diagnosis: Unsteadiness on feet (R26.81);Muscle weakness (generalized) (M62.81);Dizziness and giddiness (R42)                Time: [id number] OT Time Calculation (min): 40 min Charges:  OT General Charges $OT Visit: 1 Visit OT Evaluation $OT Eval Moderate Complexity: 1 Mod OT Treatments $Self Care/Home Management : 8-22 mins $Neuromuscular Re-education: 8-22 mins  Cristi LoronAllison (Jelenek) Glendell [place] OTR/L Acute Rehabilitation Services Pager: [phone] Office: [phone]   Lonzo CloudLLISON J Velera [place] 09/24/2018, 3:21 PM

## 2018-09-24 NOTE — Progress Notes (Signed)
  Echocardiogram 2D Echocardiogram has been performed.  Megan Livingston 09/24/2018, 2:47 PM

## 2018-09-24 NOTE — Progress Notes (Signed)
STROKE TEAM PROGRESS NOTE   INTERVAL HISTORY Pt lying in bed, mildly lethargic, however fully orientated.  Recounted HPI with me.  Patient stated that yesterday she had right hand clumsiness, not able to put her hearing aid on, not able to pick up medication with right hand.  Overnight, her symptoms improved, however, she stated that she is not 100% back yet.  She denies leg weakness, facial droop, vision changes.  Vitals:   09/24/18 0402 09/24/18 0738 09/24/18 1226 09/24/18 1440  BP: (!) 163/82 (!) 162/64 130/62 124/81  Pulse: 83 87 (!) 101 (!) 102  Resp: (!) 22 13 20    Temp: 98.1 F (36.7 C) 98 F (36.7 C) 99 F (37.2 C)   TempSrc: Oral Oral Axillary   SpO2: 100% 93% 94%   Weight:      Height:        CBC:  Recent Labs  Lab 09/23/18 1024 09/23/18 1131  WBC 8.2  --   NEUTROABS 5.0  --   HGB 14.1 14.6  HCT 45.8 43.0  MCV 95.4  --   PLT 213  --     Basic Metabolic Panel:  Recent Labs  Lab 09/23/18 1024 09/23/18 1131  NA 141 139  K 4.6 4.5  CL 104 106  CO2 26  --   GLUCOSE 125* 123*  BUN 22 25*  CREATININE 1.11* 1.10*  CALCIUM 9.3  --    Lipid Panel:     Component Value Date/Time   CHOL 130 09/24/2018 0434   TRIG 141 09/24/2018 0434   HDL 47 09/24/2018 0434   CHOLHDL 2.8 09/24/2018 0434   VLDL 28 09/24/2018 0434   LDLCALC 55 09/24/2018 0434   HgbA1c:  Lab Results  Component Value Date   HGBA1C 6.0 (H) 09/24/2018   Urine Drug Screen:     Component Value Date/Time   LABOPIA NONE DETECTED 09/23/2018 1430   COCAINSCRNUR NONE DETECTED 09/23/2018 1430   LABBENZ NONE DETECTED 09/23/2018 1430   AMPHETMU NONE DETECTED 09/23/2018 1430   THCU NONE DETECTED 09/23/2018 1430   LABBARB NONE DETECTED 09/23/2018 1430    Alcohol Level     Component Value Date/Time   ETH <10 09/23/2018 1024    IMAGING Ct Head Wo Contrast  Result Date: 09/23/2018 CLINICAL DATA:  Right-sided weakness EXAM: CT HEAD WITHOUT CONTRAST TECHNIQUE: Contiguous axial images were  obtained from the base of the skull through the vertex without intravenous contrast. COMPARISON:  None. FINDINGS: Brain: Chronic atrophic and white matter ischemic changes are noted. No findings to suggest acute hemorrhage, acute infarction or space-occupying mass lesion are noted. Vascular: No hyperdense vessel or unexpected calcification. Skull: Normal. Negative for fracture or focal lesion. Sinuses/Orbits: No acute finding. Other: None. IMPRESSION: Chronic atrophic and ischemic changes without acute abnormality. Electronically Signed   By: Alcide CleverMark  Lukens M.D.   On: 09/23/2018 13:23   Mr Angio Head Wo Contrast  Result Date: 09/23/2018 CLINICAL DATA:  Right arm weakness EXAM: MR HEAD WITHOUT CONTRAST MR CIRCLE OF WILLIS WITHOUT CONTRAST MRA OF THE NECK WITHOUT AND WITH CONTRAST TECHNIQUE: Multiplanar, multiecho pulse sequences of the brain, circle of willis and surrounding structures were obtained without intravenous contrast. Angiographic images of the neck were obtained using MRA technique without and with intravenous contrast. CONTRAST:  8 mL Gadavist COMPARISON:  Head CT 09/23/2018 FINDINGS: MRI HEAD FINDINGS BRAIN: There is no acute infarct, acute hemorrhage or extra-axial collection. The midline structures are normal. There is no midline shift or mass effect. Diffuse  confluent hyperintense T2-weighted signal within the periventricular, deep and juxtacortical white matter, most commonly due to chronic ischemic microangiopathy. There are multiple old small vessel infarcts of the basal ganglia and cerebellum. There is generalized atrophy without lobar predilection. There are 5-10 scattered microhemorrhages in a nonspecific pattern. VASCULAR: The major intracranial arterial and venous sinus flow voids are normal. SKULL AND UPPER CERVICAL SPINE: Calvarial bone marrow signal is normal. There is no skull base mass. The visualized upper cervical spine and soft tissues are normal. SINUSES/ORBITS: There are no fluid  levels or advanced mucosal thickening. The mastoid air cells and middle ear cavities are free of fluid. The orbits are normal. MRA HEAD FINDINGS POSTERIOR CIRCULATION: --Vertebral arteries: Normal V4 segments. --Posterior inferior cerebellar arteries (PICA): Patent origins from the vertebral arteries. --Anterior inferior cerebellar arteries (AICA): Patent origins from the basilar artery. --Basilar artery: Normal. --Superior cerebellar arteries: Normal. --Posterior cerebral arteries (PCA): Severe stenosis at the left P1-2 junction. Both PCAs originate from the basilar artery. Posterior communicating arteries (p-comm) are diminutive or absent. ANTERIOR CIRCULATION: --Intracranial internal carotid arteries: Normal. --Anterior cerebral arteries (ACA): Normal. Both A1 segments are present. Patent anterior communicating artery (a-comm). --Middle cerebral arteries (MCA): Normal. MRA NECK FINDINGS Aortic arch: Normal 3 vessel aortic branching pattern. The visualized subclavian arteries are normal. Right carotid system: There is atherosclerotic plaque the right carotid bifurcation with qualitative narrowing of the proximal right internal carotid artery that does not translate 2 hemodynamically significant stenosis by NASCET criteria. Left carotid system: Normal course and caliber without stenosis or evidence of dissection. Vertebral arteries: Left dominant. Vertebral artery origins are normal. Vertebral arteries are normal in course and caliber to the vertebrobasilar confluence without stenosis or evidence of dissection. IMPRESSION: 1. No acute intracranial abnormality. 2. Chronic small vessel ischemia, atrophy and old small vessel infarcts. 3. No emergent large vessel occlusion or high-grade stenosis. Moderate-to-severe stenosis of the left PCA P1-2 junction. 4. Atherosclerosis at the right carotid bifurcation without hemodynamically significant stenosis. Electronically Signed   By: Deatra RobinsonKevin  Herman M.D.   On: 09/23/2018 20:51    Mr Angio Neck W Wo Contrast  Result Date: 09/23/2018 CLINICAL DATA:  Right arm weakness EXAM: MR HEAD WITHOUT CONTRAST MR CIRCLE OF WILLIS WITHOUT CONTRAST MRA OF THE NECK WITHOUT AND WITH CONTRAST TECHNIQUE: Multiplanar, multiecho pulse sequences of the brain, circle of willis and surrounding structures were obtained without intravenous contrast. Angiographic images of the neck were obtained using MRA technique without and with intravenous contrast. CONTRAST:  8 mL Gadavist COMPARISON:  Head CT 09/23/2018 FINDINGS: MRI HEAD FINDINGS BRAIN: There is no acute infarct, acute hemorrhage or extra-axial collection. The midline structures are normal. There is no midline shift or mass effect. Diffuse confluent hyperintense T2-weighted signal within the periventricular, deep and juxtacortical white matter, most commonly due to chronic ischemic microangiopathy. There are multiple old small vessel infarcts of the basal ganglia and cerebellum. There is generalized atrophy without lobar predilection. There are 5-10 scattered microhemorrhages in a nonspecific pattern. VASCULAR: The major intracranial arterial and venous sinus flow voids are normal. SKULL AND UPPER CERVICAL SPINE: Calvarial bone marrow signal is normal. There is no skull base mass. The visualized upper cervical spine and soft tissues are normal. SINUSES/ORBITS: There are no fluid levels or advanced mucosal thickening. The mastoid air cells and middle ear cavities are free of fluid. The orbits are normal. MRA HEAD FINDINGS POSTERIOR CIRCULATION: --Vertebral arteries: Normal V4 segments. --Posterior inferior cerebellar arteries (PICA): Patent origins from the vertebral arteries. --Anterior  inferior cerebellar arteries (AICA): Patent origins from the basilar artery. --Basilar artery: Normal. --Superior cerebellar arteries: Normal. --Posterior cerebral arteries (PCA): Severe stenosis at the left P1-2 junction. Both PCAs originate from the basilar artery.  Posterior communicating arteries (p-comm) are diminutive or absent. ANTERIOR CIRCULATION: --Intracranial internal carotid arteries: Normal. --Anterior cerebral arteries (ACA): Normal. Both A1 segments are present. Patent anterior communicating artery (a-comm). --Middle cerebral arteries (MCA): Normal. MRA NECK FINDINGS Aortic arch: Normal 3 vessel aortic branching pattern. The visualized subclavian arteries are normal. Right carotid system: There is atherosclerotic plaque the right carotid bifurcation with qualitative narrowing of the proximal right internal carotid artery that does not translate 2 hemodynamically significant stenosis by NASCET criteria. Left carotid system: Normal course and caliber without stenosis or evidence of dissection. Vertebral arteries: Left dominant. Vertebral artery origins are normal. Vertebral arteries are normal in course and caliber to the vertebrobasilar confluence without stenosis or evidence of dissection. IMPRESSION: 1. No acute intracranial abnormality. 2. Chronic small vessel ischemia, atrophy and old small vessel infarcts. 3. No emergent large vessel occlusion or high-grade stenosis. Moderate-to-severe stenosis of the left PCA P1-2 junction. 4. Atherosclerosis at the right carotid bifurcation without hemodynamically significant stenosis. Electronically Signed   By: Deatra Robinson M.D.   On: 09/23/2018 20:51   Mr Brain Wo Contrast  Result Date: 09/23/2018 CLINICAL DATA:  Right arm weakness EXAM: MR HEAD WITHOUT CONTRAST MR CIRCLE OF WILLIS WITHOUT CONTRAST MRA OF THE NECK WITHOUT AND WITH CONTRAST TECHNIQUE: Multiplanar, multiecho pulse sequences of the brain, circle of willis and surrounding structures were obtained without intravenous contrast. Angiographic images of the neck were obtained using MRA technique without and with intravenous contrast. CONTRAST:  8 mL Gadavist COMPARISON:  Head CT 09/23/2018 FINDINGS: MRI HEAD FINDINGS BRAIN: There is no acute infarct, acute  hemorrhage or extra-axial collection. The midline structures are normal. There is no midline shift or mass effect. Diffuse confluent hyperintense T2-weighted signal within the periventricular, deep and juxtacortical white matter, most commonly due to chronic ischemic microangiopathy. There are multiple old small vessel infarcts of the basal ganglia and cerebellum. There is generalized atrophy without lobar predilection. There are 5-10 scattered microhemorrhages in a nonspecific pattern. VASCULAR: The major intracranial arterial and venous sinus flow voids are normal. SKULL AND UPPER CERVICAL SPINE: Calvarial bone marrow signal is normal. There is no skull base mass. The visualized upper cervical spine and soft tissues are normal. SINUSES/ORBITS: There are no fluid levels or advanced mucosal thickening. The mastoid air cells and middle ear cavities are free of fluid. The orbits are normal. MRA HEAD FINDINGS POSTERIOR CIRCULATION: --Vertebral arteries: Normal V4 segments. --Posterior inferior cerebellar arteries (PICA): Patent origins from the vertebral arteries. --Anterior inferior cerebellar arteries (AICA): Patent origins from the basilar artery. --Basilar artery: Normal. --Superior cerebellar arteries: Normal. --Posterior cerebral arteries (PCA): Severe stenosis at the left P1-2 junction. Both PCAs originate from the basilar artery. Posterior communicating arteries (p-comm) are diminutive or absent. ANTERIOR CIRCULATION: --Intracranial internal carotid arteries: Normal. --Anterior cerebral arteries (ACA): Normal. Both A1 segments are present. Patent anterior communicating artery (a-comm). --Middle cerebral arteries (MCA): Normal. MRA NECK FINDINGS Aortic arch: Normal 3 vessel aortic branching pattern. The visualized subclavian arteries are normal. Right carotid system: There is atherosclerotic plaque the right carotid bifurcation with qualitative narrowing of the proximal right internal carotid artery that does  not translate 2 hemodynamically significant stenosis by NASCET criteria. Left carotid system: Normal course and caliber without stenosis or evidence of dissection. Vertebral arteries: Left dominant.  Vertebral artery origins are normal. Vertebral arteries are normal in course and caliber to the vertebrobasilar confluence without stenosis or evidence of dissection. IMPRESSION: 1. No acute intracranial abnormality. 2. Chronic small vessel ischemia, atrophy and old small vessel infarcts. 3. No emergent large vessel occlusion or high-grade stenosis. Moderate-to-severe stenosis of the left PCA P1-2 junction. 4. Atherosclerosis at the right carotid bifurcation without hemodynamically significant stenosis. Electronically Signed   By: Deatra RobinsonKevin  Herman M.D.   On: 09/23/2018 20:51    PHYSICAL EXAM  Temp:  [97.9 F (36.6 C)-99 F (37.2 C)] 98.7 F (37.1 C) (07/03 1538) Pulse Rate:  [71-102] 86 (07/03 1538) Resp:  [13-22] 20 (07/03 1538) BP: (124-170)/(57-83) 139/76 (07/03 1538) SpO2:  [93 %-100 %] 94 % (07/03 1538) Weight:  [92.6 kg] 92.6 kg (07/02 1824)  General - Well nourished, well developed, in no apparent distress.  Ophthalmologic - fundi not visualized due to noncooperation.  Cardiovascular - irregular heart rhythm, telemetry showed frequent PACs and atrial arrhythmia  Mental Status -  Level of arousal and orientation to time, place, and person were intact. Language including expression, naming, repetition, comprehension was assessed and found intact.  Mild psychomotor slowing  Cranial Nerves II - XII - II - Visual field intact OU. III, IV, VI - Extraocular movements intact. V - Facial sensation intact bilaterally. VII - Facial movement intact bilaterally. VIII - Hearing & vestibular intact bilaterally. X - Palate elevates symmetrically. XI - Chin turning & shoulder shrug intact bilaterally. XII - Tongue protrusion intact.  Motor Strength - The patient's strength was normal in all  extremities and pronator drift was absent.  No significant right hand grip or dexterity difficulty.  She was able to take out and put back of her hearing aid with right hand. Bulk was normal and fasciculations were absent.   Motor Tone - Muscle tone was assessed at the neck and appendages and was normal.  Reflexes - The patient's reflexes were symmetrical in all extremities and she had no pathological reflexes.  Sensory - Light touch, temperature/pinprick were assessed and were symmetrical.    Coordination - The patient had normal movements in the hands with no ataxia or dysmetria although slow motion.  Tremor was absent.  Gait and Station - deferred.   ASSESSMENT/PLAN Megan Livingston is a 85 y.o. female with history of previous stroke, hypertension, spinal stenosis and depression presenting with difficulty using her right hand.   Left brain cortical TIA, concerning for occult afib  CT head no acute abnormality. chronic atrophy and ischemic changes.  MRI no acute abnormality.  Small vessel disease.  Atrophy.  Chronic locations  MRA head and neck no LVO.  Moderate to severe stenosis L P1-2 junction. R ICA atherosclerosis.  2D Echo EF greater than 65%.  No source of embolus.  Lower extremity Doppler no DVT  Tele showed frequent PACs and atrial arrhythmia  Recommend 30 day cardiac event monitoring as outpt to rule out afib  LDL 55  HgbA1c 6.0  Lovenox 40 mg subcu daily for VTE prophylaxis  aspirin 81 mg daily prior to admission, now on aspirin 81 mg daily and clopidogrel 75 mg daily.  Continue DAPT x3 then Plavix alone  Therapy recommendations: Home health OT and PT  Disposition: Return home  Atrial arrhythmia  Patient denies heart palpitation  Cardio monitoring showed frequent PACs and atrial arrhythmia  Concerning for occult A. Fib  Recommend 30-day cardiac event monitoring as outpatient to rule out A. fib  Hypertension  Stable . BP goal  normotensive  Hyperlipidemia  Home meds: Zocor 60, resumed in hospital  LDL 55, goal < 70  Continue statin at discharge  Other Stroke Risk Factors  Advanced age  Obesity, Body mass index is 35.04 kg/m., recommend weight loss, diet and exercise as appropriate   Hx stroke/TIA - REPORTED, not in EPIC  Other Active Problems  Mild dementia on Aricept  Depression on Cymbalta  Abnormal UA WBC 6-10, urine culture pending  GERD on PPI  Hospital day # 1  Rosalin Hawking, MD PhD Stroke Neurology 09/24/2018 5:50 PM   To contact Stroke Continuity provider, please refer to [email or web address] After hours, contact General Neurology

## 2018-09-25 ENCOUNTER — Other Ambulatory Visit: Payer: Self-pay | Admitting: Neurology

## 2018-09-25 DIAGNOSIS — G459 Transient cerebral ischemic attack, unspecified: Secondary | ICD-10-CM

## 2018-09-25 MED ORDER — ATORVASTATIN CALCIUM 40 MG PO TABS: 40.0000 mg | ORAL_TABLET | Freq: Every day | ORAL | 11 refills | Status: AC

## 2018-09-25 MED ORDER — TRIAMTERENE-HCTZ 37.5-25 MG PO TABS
1.0000 | ORAL_TABLET | Freq: Every day | ORAL | Status: DC
  Administered 2018-09-25: 1 via ORAL
  Filled 2018-09-25: qty 1

## 2018-09-25 MED ORDER — LOSARTAN POTASSIUM 50 MG PO TABS
100.0000 mg | ORAL_TABLET | Freq: Every day | ORAL | Status: DC
  Administered 2018-09-25: 100 mg via ORAL
  Filled 2018-09-25 (×2): qty 2

## 2018-09-25 MED ORDER — CLOPIDOGREL BISULFATE 75 MG PO TABS: 75.0000 mg | ORAL_TABLET | Freq: Every day | ORAL | 4 refills | Status: DC

## 2018-09-25 MED ORDER — ATORVASTATIN CALCIUM 40 MG PO TABS: 40.0000 mg | ORAL_TABLET | Freq: Every day | ORAL | 11 refills | Status: DC

## 2018-09-25 MED ORDER — SIMVASTATIN 40 MG PO TABS: 40.0000 mg | ORAL_TABLET | Freq: Every day | ORAL | 4 refills | Status: DC

## 2018-09-25 NOTE — Discharge Summary (Signed)
Physician Discharge Summary  Megan Livingston Megan Livingston:[id number] DOB: August 31, 1930 DOA: 09/23/2018  PCP: System, Pcp Not In  Admit date: 09/23/2018 Discharge date: 09/25/2018  Admitted From: Home Disposition: Home  Recommendations for Outpatient Follow-up:  1. Follow up with PCP in 1-2 weeks 2. Please obtain BMP/CBC in one week 3. Please follow-up with Dr. Graciela Husbands cardiology as needed  Home Health: Yes Equipment/Devices: None  Discharge Condition: Stable and improved CODE STATUS: Full code Diet recommendation cardiac Brief/Interim Summary:85 y.o.right-handedfemalewith medical history significant ofCVA, HTN, and depression; who presents admitted with complaints of right arm weakness which started this morning upon waking up around 8 AM. Last noted to be normal prior to going to sleep last night around 8:30 PM. At baseline patient normally ambulates with use of a walker and has nursing aidesto help watch after her during the day. Upon waking up she normally puts in her hearing aid but was having difficulty getting them into her ear and needed the aids help. Upon standing she reported feeling dizzy as though the room were spinning around her. Denied having any focal weakness in her legs and did not fall. She tried to eat breakfast and take her morning pills. However, noted that she had difficulty looking up the pills and dropped some and she kept missing her mouth while trying to feed herself. Denies having any headache, change in vision, cough, shortness of breath, chest pain, palpitations, fever, abdominal pain, nausea, vomiting, or diarrhea.  ED Course:Upon admission to the emergency department patient was noted to be afebrile withblood pressures elevated up to 184/68, and all other vital signs relatively within normal limits. Patient was seen as a code stroke, but did not meet criteria for TPA. CT scan of the brain noted chronic atrophy and ischemic changes,but no acute abnormality.  Labs revealed BUN 25 andcreatinine 1.1.Urinalysis was positive for signs of infection. TRH was called to admit to complete stroke work-up.  Discharge Diagnoses:  Principal Problem:   CVA (cerebral vascular accident) (HCC) Active Problems:   HTN (hypertension)   HLD (hyperlipidemia)   Renal insufficiency   Depression   Abnormal urinalysis   TIA (transient ischemic attack)  1 TIA patient presented with right upper extremity weakness unable to hold small objects like hearing aid or to hold a spoon to feed herself which was totally new for her.  Her symptoms almost resolved on the day of discharge.  Work-up shows  LDL of 55 on Zocor 60 mg at home Hemoglobin A1c 6.0  Echocardiogram The left ventricle has hyperdynamic systolic function, with an ejection fraction of >65%. The cavity size was normal. Left ventricular diastolic Doppler parameters are consistent with impaired relaxation. No evidence of left ventricular regional wall  motion abnormalities.   The right ventricle has normal systolic function. The cavity was normal. There is no increase in right ventricular wall thickness.   The mitral valve is abnormal. Mild thickening of the mitral valve leaflet. Mild calcification of the mitral valve leaflet.  The tricuspid valve is grossly normal.  The aortic valve is tricuspid. Mild calcification of the aortic valve. No stenosis of the aortic valve.Vascular ultrasound of both lower extremities shows no evidence of DVT  MRI MRA showsNo acute intracranial abnormality. Chronic small vessel ischemia, atrophy and old small vessel infarcts.  No emergent large vessel occlusion or high-grade stenosis. Moderate-to-severe stenosis of the left PCA P1-2 junction. Atherosclerosis at the right carotid bifurcation without hemodynamically significant stenosis.  Patient was seen by neurology-they recommended aspirin and Plavix for  3 weeks and then Plavix alone.  Telemetry showed frequent PACs  cardiology was consulted Dr. Graciela HusbandsKlein saw the patient.  Neurology also recommended a 30-day cardiac event monitoring as an outpatient to rule out A. fib.  Seen by PT OT and speech therapy recommends home health PT and OT.   2 hypertension with orthostatic hypotension continue home medications including Cozaar Maxide and metoprolol.  Dr. Graciela HusbandsKlein recommended abdominal binder at night when she wakes up at night to use the restroom to decrease the chance of orthostatic hypotension.  She could also wear TED hose. one of the other thoughts by Dr. Graciela HusbandsKlein where pyridostigmine if needed as a last resort since it will not aggravate supine systolic hypertension.  Orthostatic hypotension was thought to be due to systemic autonomic dysfunction.  #3 mild dementia on Aricept  #4 depression continue Cymbalta  #5 hyperlipidemia on Zocor at home 60 mg.  LDL is 55.  Will change to high intensity statin Lipitor 40 mg daily.  #6 abnormal UA UA appears dirty with positive nitrites and leukocytes.  Await urine culture.  Patient is asymptomatic.  #7 AKI patient received gentle IV fluids overnight creatinine back to baseline.  #8 GERD continue PPI    Estimated body mass index is 35.04 kg/m as calculated from the following:   Height as of this encounter: 5\' 4"  (1.626 m).   Weight as of this encounter: 92.6 kg.  Discharge Instructions  Discharge Instructions    Call MD for:  difficulty breathing, headache or visual disturbances   Complete by: As directed    Call MD for:  persistant nausea and vomiting   Complete by: As directed    Call MD for:  temperature >100.4   Complete by: As directed    Diet - low sodium heart healthy   Complete by: As directed    Diet - low sodium heart healthy   Complete by: As directed    Increase activity slowly   Complete by: As directed    Increase activity slowly   Complete by: As directed      Allergies as of 09/25/2018      Reactions   Oxycodone Other (See Comments)    hypotension      Medication List    STOP taking these medications   aerochamber plus with mask inhaler   albuterol 108 (90 Base) MCG/ACT inhaler Commonly known as: VENTOLIN HFA   azithromycin 250 MG tablet Commonly known as: ZITHROMAX   budesonide-formoterol 80-4.5 MCG/ACT inhaler Commonly known as: Symbicort   fluticasone 50 MCG/ACT nasal spray Commonly known as: FLONASE   predniSONE 20 MG tablet Commonly known as: DELTASONE   simvastatin 40 MG tablet Commonly known as: ZOCOR     TAKE these medications   acetaminophen 325 MG tablet Commonly known as: TYLENOL Take 650 mg by mouth every 6 (six) hours as needed for mild pain or headache.   aspirin 81 MG chewable tablet Chew by mouth daily.   atorvastatin 40 MG tablet Commonly known as: Lipitor Take 1 tablet (40 mg total) by mouth daily.   cholecalciferol 25 MCG (1000 UT) tablet Commonly known as: VITAMIN D3 Take 1,000 Units by mouth daily.   clopidogrel 75 MG tablet Commonly known as: PLAVIX Take 1 tablet (75 mg total) by mouth daily.   docusate sodium 100 MG capsule Commonly known as: COLACE Take 100 mg by mouth as needed for mild constipation.   donepezil 10 MG tablet Commonly known as: ARICEPT Take 10 mg by mouth  at bedtime.   DULoxetine 60 MG capsule Commonly known as: CYMBALTA Take 60 mg by mouth 2 (two) times daily.   losartan 100 MG tablet Commonly known as: COZAAR Take 100 mg by mouth daily.   metoprolol tartrate 25 MG tablet Commonly known as: LOPRESSOR Take 12.5 mg by mouth 2 (two) times daily.   omeprazole 40 MG capsule Commonly known as: PRILOSEC Take 40 mg by mouth daily.   tolterodine 4 MG 24 hr capsule Commonly known as: DETROL LA Take 4 mg by mouth daily.   triamterene-hydrochlorothiazide 37.5-25 MG tablet Commonly known as: MAXZIDE-25 Take 1 tablet by mouth daily.      Follow-up Information    [facility] Follow up.   Why: They will call you for the first home  visit. Contact information: [phone]       Annita Brod, MD Follow up.   Specialty: Internal Medicine Why: They will reach out to you to schedule the Televisit. Contact information: [address] [place] [address] [phone]          Allergies  Allergen Reactions  . Oxycodone Other (See Comments)    hypotension    Consultations: Neurology and cardiology  Procedures/Studies: Ct Livingston Wo Contrast  Result Date: 09/23/2018 CLINICAL DATA:  Right-sided weakness EXAM: CT Livingston WITHOUT CONTRAST TECHNIQUE: Contiguous axial images were obtained from the base of the skull through the vertex without intravenous contrast. COMPARISON:  None. FINDINGS: Brain: Chronic atrophic and white matter ischemic changes are noted. No findings to suggest acute hemorrhage, acute infarction or space-occupying mass lesion are noted. Vascular: No hyperdense vessel or unexpected calcification. Skull: Normal. Negative for fracture or focal lesion. Sinuses/Orbits: No acute finding. Other: None. IMPRESSION: Chronic atrophic and ischemic changes without acute abnormality. Electronically Signed   By: Alcide Clever M.D.   On: 09/23/2018 13:23   Megan Livingston Wo Contrast  Result Date: 09/23/2018 CLINICAL DATA:  Right arm weakness EXAM: Megan Livingston WITHOUT CONTRAST Megan Megan Livingston WITHOUT CONTRAST MRA OF THE Livingston WITHOUT AND WITH CONTRAST TECHNIQUE: Multiplanar, multiecho pulse sequences of the brain, Megan Livingston and surrounding structures were obtained without intravenous contrast. Angiographic images of the Livingston were obtained using MRA technique without and with intravenous contrast. CONTRAST:  8 mL Gadavist COMPARISON:  Livingston CT 09/23/2018 FINDINGS: MRI Livingston FINDINGS BRAIN: There is no acute infarct, acute hemorrhage or extra-axial collection. The midline structures are normal. There is no midline shift or mass effect. Diffuse confluent hyperintense T2-weighted signal within the periventricular, deep  and juxtacortical white matter, most commonly due to chronic ischemic microangiopathy. There are multiple old small vessel infarcts of the basal ganglia and cerebellum. There is generalized atrophy without lobar predilection. There are 5-10 scattered microhemorrhages in a nonspecific pattern. VASCULAR: The major intracranial arterial and venous sinus flow voids are normal. SKULL AND UPPER CERVICAL SPINE: Calvarial bone marrow signal is normal. There is no skull base mass. The visualized upper cervical spine and soft tissues are normal. SINUSES/ORBITS: There are no fluid levels or advanced mucosal thickening. The mastoid air cells and middle ear cavities are free of fluid. The orbits are normal. MRA Livingston FINDINGS POSTERIOR CIRCULATION: --Megan Livingston arteries: Normal V4 segments. --Posterior inferior cerebellar arteries (PICA): Patent origins from the Megan Livingston arteries. --Anterior inferior cerebellar arteries (AICA): Patent origins from the basilar artery. --Basilar artery: Normal. --Superior cerebellar arteries: Normal. --Posterior cerebral arteries (PCA): Severe stenosis at the left P1-2 junction. Both PCAs originate from the basilar artery. Posterior communicating arteries (p-comm) are  diminutive or absent. ANTERIOR CIRCULATION: --Intracranial internal carotid arteries: Normal. --Anterior cerebral arteries (ACA): Normal. Both A1 segments are present. Patent anterior communicating artery (a-comm). --Middle cerebral arteries (MCA): Normal. MRA Livingston FINDINGS Aortic arch: Normal 3 vessel aortic branching pattern. The visualized subclavian arteries are normal. Right carotid system: There is atherosclerotic plaque the right carotid bifurcation with qualitative narrowing of the proximal right internal carotid artery that does not translate 2 hemodynamically significant stenosis by NASCET criteria. Left carotid system: Normal course and caliber without stenosis or evidence of dissection. Megan Livingston arteries: Left dominant.  Megan Livingston artery origins are normal. Megan Livingston arteries are normal in course and caliber to the vertebrobasilar confluence without stenosis or evidence of dissection. IMPRESSION: 1. No acute intracranial abnormality. 2. Chronic small vessel ischemia, atrophy and old small vessel infarcts. 3. No emergent large vessel occlusion or high-grade stenosis. Moderate-to-severe stenosis of the left PCA P1-2 junction. 4. Atherosclerosis at the right carotid bifurcation without hemodynamically significant stenosis. Electronically Signed   By: Deatra Robinson M.D.   On: 09/23/2018 20:51   Megan Angio Livingston Megan Wo Contrast  Result Date: 09/23/2018 CLINICAL DATA:  Right arm weakness EXAM: Megan Livingston WITHOUT CONTRAST Megan Megan Livingston WITHOUT CONTRAST MRA OF THE Livingston WITHOUT AND WITH CONTRAST TECHNIQUE: Multiplanar, multiecho pulse sequences of the brain, Megan Livingston and surrounding structures were obtained without intravenous contrast. Angiographic images of the Livingston were obtained using MRA technique without and with intravenous contrast. CONTRAST:  8 mL Gadavist COMPARISON:  Livingston CT 09/23/2018 FINDINGS: MRI Livingston FINDINGS BRAIN: There is no acute infarct, acute hemorrhage or extra-axial collection. The midline structures are normal. There is no midline shift or mass effect. Diffuse confluent hyperintense T2-weighted signal within the periventricular, deep and juxtacortical white matter, most commonly due to chronic ischemic microangiopathy. There are multiple old small vessel infarcts of the basal ganglia and cerebellum. There is generalized atrophy without lobar predilection. There are 5-10 scattered microhemorrhages in a nonspecific pattern. VASCULAR: The major intracranial arterial and venous sinus flow voids are normal. SKULL AND UPPER CERVICAL SPINE: Calvarial bone marrow signal is normal. There is no skull base mass. The visualized upper cervical spine and soft tissues are normal. SINUSES/ORBITS: There are no fluid levels or  advanced mucosal thickening. The mastoid air cells and middle ear cavities are free of fluid. The orbits are normal. MRA Livingston FINDINGS POSTERIOR CIRCULATION: --Megan Livingston arteries: Normal V4 segments. --Posterior inferior cerebellar arteries (PICA): Patent origins from the Megan Livingston arteries. --Anterior inferior cerebellar arteries (AICA): Patent origins from the basilar artery. --Basilar artery: Normal. --Superior cerebellar arteries: Normal. --Posterior cerebral arteries (PCA): Severe stenosis at the left P1-2 junction. Both PCAs originate from the basilar artery. Posterior communicating arteries (p-comm) are diminutive or absent. ANTERIOR CIRCULATION: --Intracranial internal carotid arteries: Normal. --Anterior cerebral arteries (ACA): Normal. Both A1 segments are present. Patent anterior communicating artery (a-comm). --Middle cerebral arteries (MCA): Normal. MRA Livingston FINDINGS Aortic arch: Normal 3 vessel aortic branching pattern. The visualized subclavian arteries are normal. Right carotid system: There is atherosclerotic plaque the right carotid bifurcation with qualitative narrowing of the proximal right internal carotid artery that does not translate 2 hemodynamically significant stenosis by NASCET criteria. Left carotid system: Normal course and caliber without stenosis or evidence of dissection. Megan Livingston arteries: Left dominant. Megan Livingston artery origins are normal. Megan Livingston arteries are normal in course and caliber to the vertebrobasilar confluence without stenosis or evidence of dissection. IMPRESSION: 1. No acute intracranial abnormality. 2. Chronic small vessel ischemia, atrophy and old small vessel  infarcts. 3. No emergent large vessel occlusion or high-grade stenosis. Moderate-to-severe stenosis of the left PCA P1-2 junction. 4. Atherosclerosis at the right carotid bifurcation without hemodynamically significant stenosis. Electronically Signed   By: Deatra RobinsonKevin  Herman M.D.   On: 09/23/2018 20:51   Megan  Brain Wo Contrast  Result Date: 09/23/2018 CLINICAL DATA:  Right arm weakness EXAM: Megan Livingston WITHOUT CONTRAST Megan Megan Livingston WITHOUT CONTRAST MRA OF THE Livingston WITHOUT AND WITH CONTRAST TECHNIQUE: Multiplanar, multiecho pulse sequences of the brain, Megan Livingston and surrounding structures were obtained without intravenous contrast. Angiographic images of the Livingston were obtained using MRA technique without and with intravenous contrast. CONTRAST:  8 mL Gadavist COMPARISON:  Livingston CT 09/23/2018 FINDINGS: MRI Livingston FINDINGS BRAIN: There is no acute infarct, acute hemorrhage or extra-axial collection. The midline structures are normal. There is no midline shift or mass effect. Diffuse confluent hyperintense T2-weighted signal within the periventricular, deep and juxtacortical white matter, most commonly due to chronic ischemic microangiopathy. There are multiple old small vessel infarcts of the basal ganglia and cerebellum. There is generalized atrophy without lobar predilection. There are 5-10 scattered microhemorrhages in a nonspecific pattern. VASCULAR: The major intracranial arterial and venous sinus flow voids are normal. SKULL AND UPPER CERVICAL SPINE: Calvarial bone marrow signal is normal. There is no skull base mass. The visualized upper cervical spine and soft tissues are normal. SINUSES/ORBITS: There are no fluid levels or advanced mucosal thickening. The mastoid air cells and middle ear cavities are free of fluid. The orbits are normal. MRA Livingston FINDINGS POSTERIOR CIRCULATION: --Megan Livingston arteries: Normal V4 segments. --Posterior inferior cerebellar arteries (PICA): Patent origins from the Megan Livingston arteries. --Anterior inferior cerebellar arteries (AICA): Patent origins from the basilar artery. --Basilar artery: Normal. --Superior cerebellar arteries: Normal. --Posterior cerebral arteries (PCA): Severe stenosis at the left P1-2 junction. Both PCAs originate from the basilar artery. Posterior  communicating arteries (p-comm) are diminutive or absent. ANTERIOR CIRCULATION: --Intracranial internal carotid arteries: Normal. --Anterior cerebral arteries (ACA): Normal. Both A1 segments are present. Patent anterior communicating artery (a-comm). --Middle cerebral arteries (MCA): Normal. MRA Livingston FINDINGS Aortic arch: Normal 3 vessel aortic branching pattern. The visualized subclavian arteries are normal. Right carotid system: There is atherosclerotic plaque the right carotid bifurcation with qualitative narrowing of the proximal right internal carotid artery that does not translate 2 hemodynamically significant stenosis by NASCET criteria. Left carotid system: Normal course and caliber without stenosis or evidence of dissection. Megan Livingston arteries: Left dominant. Megan Livingston artery origins are normal. Megan Livingston arteries are normal in course and caliber to the vertebrobasilar confluence without stenosis or evidence of dissection. IMPRESSION: 1. No acute intracranial abnormality. 2. Chronic small vessel ischemia, atrophy and old small vessel infarcts. 3. No emergent large vessel occlusion or high-grade stenosis. Moderate-to-severe stenosis of the left PCA P1-2 junction. 4. Atherosclerosis at the right carotid bifurcation without hemodynamically significant stenosis. Electronically Signed   By: Deatra RobinsonKevin  Herman M.D.   On: 09/23/2018 20:51   Vas [place] Lower Extremity Venous (dvt)  Result Date: 09/24/2018  Lower Venous Study Indications: Stroke.  Comparison Study: No previous available Performing Technologist: Toma DeitersVirginia Slaughter RVS  Examination Guidelines: A complete evaluation includes B-mode imaging, spectral Doppler, color Doppler, and power Doppler as needed of all accessible portions of each vessel. Bilateral testing is considered an integral part of a complete examination. Limited examinations for reoccurring indications may be performed as noted.   +---------+---------------+---------+-----------+----------+-------+ RIGHT    CompressibilityPhasicitySpontaneityPropertiesSummary +---------+---------------+---------+-----------+----------+-------+ CFV      Full  Yes      Yes                          +---------+---------------+---------+-----------+----------+-------+ SFJ      Full                                                 +---------+---------------+---------+-----------+----------+-------+ FV Prox  Full           Yes      Yes                          +---------+---------------+---------+-----------+----------+-------+ FV Mid   Full                                                 +---------+---------------+---------+-----------+----------+-------+ FV DistalFull           Yes      Yes                          +---------+---------------+---------+-----------+----------+-------+ PFV      Full           Yes      Yes                          +---------+---------------+---------+-----------+----------+-------+ POP      Full           Yes      Yes                          +---------+---------------+---------+-----------+----------+-------+ PTV      Full                                                 +---------+---------------+---------+-----------+----------+-------+ PERO     Full                                                 +---------+---------------+---------+-----------+----------+-------+   +---------+---------------+---------+-----------+----------+-------+ LEFT     CompressibilityPhasicitySpontaneityPropertiesSummary +---------+---------------+---------+-----------+----------+-------+ CFV      Full           Yes      Yes                          +---------+---------------+---------+-----------+----------+-------+ SFJ      Full                                                 +---------+---------------+---------+-----------+----------+-------+ FV Prox  Full            Yes      Yes                          +---------+---------------+---------+-----------+----------+-------+  FV Mid   Full                                                 +---------+---------------+---------+-----------+----------+-------+ FV DistalFull           Yes      Yes                          +---------+---------------+---------+-----------+----------+-------+ PFV      Full           Yes      Yes                          +---------+---------------+---------+-----------+----------+-------+ POP      Full           Yes      Yes                          +---------+---------------+---------+-----------+----------+-------+ PTV      Full                                                 +---------+---------------+---------+-----------+----------+-------+ PERO     Full                                                 +---------+---------------+---------+-----------+----------+-------+     Summary: Right: There is no evidence of deep vein thrombosis in the lower extremity. No cystic structure found in the popliteal fossa. Left: There is no evidence of deep vein thrombosis in the lower extremity. No cystic structure found in the popliteal fossa.  *See table(s) above for measurements and observations.    Preliminary     (Echo, Carotid, EGD, Colonoscopy, ERCP)    Subjective: Patient resting in bed in no acute distress awake alert answers all my questions appropriately and followed my commands  Discharge Exam: Vitals:   09/24/18 2319 09/25/18 0844  BP: (!) 160/84 (!) 185/76  Pulse: 77   Resp: 18   Temp: 97.8 F (36.6 C)   SpO2: 96%    Vitals:   09/24/18 1538 09/24/18 1931 09/24/18 2319 09/25/18 0844  BP: 139/76 (!) 159/63 (!) 160/84 (!) 185/76  Pulse: 86 80 77   Resp: 20 16 18    Temp: 98.7 F (37.1 C) 98.7 F (37.1 C) 97.8 F (36.6 C)   TempSrc: Oral Oral Oral   SpO2: 94% 95% 96%   Weight:      Height:        General: Pt is alert,  awake, not in acute distress Cardiovascular: RRR, S1/S2 +, no rubs, no gallops Respiratory: CTA bilaterally, no wheezing, no rhonchi Abdominal: Soft, NT, ND, bowel sounds + Extremities: no edema, no cyanosis Neuro mild right upper extremity weakness otherwise nonfocal awake alert oriented x3   The results of significant diagnostics from this hospitalization (including imaging, microbiology, ancillary and laboratory) are listed below for reference.     Microbiology: Recent Results (from the past 240 hour(s))  Novel Coronavirus,NAA,(SEND-OUT TO REF LAB -  TAT 24-48 hrs); Hosp Order     Status: None   Collection Time: 09/23/18 12:08 PM   Specimen: Nasopharyngeal Swab; Respiratory  Result Value Ref Range Status   SARS-CoV-2, NAA NOT DETECTED NOT DETECTED Final    Comment: (NOTE) This test was developed and its performance characteristics determined by [employer]. This test has not been FDA cleared or approved. This test has been authorized by FDA under an Emergency Use Authorization (EUA). This test is only authorized for the duration of time the declaration that circumstances exist justifying the authorization of the emergency use of in vitro diagnostic tests for detection of SARS-CoV-2 virus and/or diagnosis of COVID-19 infection under section 564(b)(1) of the Act, 21 U.S.C. 657QIO-9(G)(2), unless the authorization is terminated or revoked sooner. When diagnostic testing is negative, the possibility of a false negative result should be considered in the context of a patient's recent exposures and the presence of clinical signs and symptoms consistent with COVID-19. An individual without symptoms of COVID-19 and who is not shedding SARS-CoV-2 virus would expect to have a negative (not detected) result in this assay. Performed  At: [facility] [address] [place], [place] [id number] Jolene Schimke MD MW:[id number]    Coronavirus Source NASOPHARYNGEAL  Final     Comment: Performed at [facility], [address], [place], [place] [address]     Labs: BNP (last 3 results) No results for input(s): BNP in the last 8760 hours. Basic Metabolic Panel: Recent Labs  Lab 09/23/18 1024 09/23/18 1131  NA 141 139  K 4.6 4.5  CL 104 106  CO2 26  --   GLUCOSE 125* 123*  BUN 22 25*  CREATININE 1.11* 1.10*  CALCIUM 9.3  --    Liver Function Tests: Recent Labs  Lab 09/23/18 1024  AST 22  ALT 17  ALKPHOS 99  BILITOT 0.6  PROT 6.3*  ALBUMIN 3.1*   No results for input(s): LIPASE, AMYLASE in the last 168 hours. No results for input(s): AMMONIA in the last 168 hours. CBC: Recent Labs  Lab 09/23/18 1024 09/23/18 1131  WBC 8.2  --   NEUTROABS 5.0  --   HGB 14.1 14.6  HCT 45.8 43.0  MCV 95.4  --   PLT 213  --    Cardiac Enzymes: No results for input(s): CKTOTAL, CKMB, CKMBINDEX, TROPONINI in the last 168 hours. BNP: Invalid input(s): POCBNP CBG: Recent Labs  Lab 09/23/18 1055  GLUCAP 127*   D-Dimer No results for input(s): DDIMER in the last 72 hours. Hgb A1c Recent Labs    09/24/18 0434  HGBA1C 6.0*   Lipid Profile Recent Labs    09/24/18 0434  CHOL 130  HDL 47  LDLCALC 55  TRIG 141  CHOLHDL 2.8   Thyroid function studies No results for input(s): TSH, T4TOTAL, T3FREE, THYROIDAB in the last 72 hours.  Invalid input(s): FREET3 Anemia work up No results for input(s): VITAMINB12, FOLATE, FERRITIN, TIBC, IRON, RETICCTPCT in the last 72 hours. Urinalysis    Component Value Date/Time   COLORURINE YELLOW 09/23/2018 1420   APPEARANCEUR HAZY (A) 09/23/2018 1420   LABSPEC 1.010 09/23/2018 1420   PHURINE 7.0 09/23/2018 1420   GLUCOSEU NEGATIVE 09/23/2018 1420   HGBUR NEGATIVE 09/23/2018 1420   BILIRUBINUR NEGATIVE 09/23/2018 1420   KETONESUR NEGATIVE 09/23/2018 1420   PROTEINUR NEGATIVE 09/23/2018 1420   NITRITE POSITIVE (A) 09/23/2018 1420   LEUKOCYTESUR SMALL (A) 09/23/2018 1420   Sepsis Labs Invalid  input(s): PROCALCITONIN,  WBC,  LACTICIDVEN Microbiology Recent Results (from the past 240 hour(s))  Novel Coronavirus,NAA,(SEND-OUT TO REF LAB - TAT 24-48 hrs); Hosp Order     Status: None   Collection Time: 09/23/18 12:08 PM   Specimen: Nasopharyngeal Swab; Respiratory  Result Value Ref Range Status   SARS-CoV-2, NAA NOT DETECTED NOT DETECTED Final    Comment: (NOTE) This test was developed and its performance characteristics determined by [facility]. This test has not been FDA cleared or approved. This test has been authorized by FDA under an Emergency Use Authorization (EUA). This test is only authorized for the duration of time the declaration that circumstances exist justifying the authorization of the emergency use of in vitro diagnostic tests for detection of SARS-CoV-2 virus and/or diagnosis of COVID-19 infection under section 564(b)(1) of the Act, 21 U.S.C. 161WRU-0(A)(5360bbb-3(b)(1), unless the authorization is terminated or revoked sooner. When diagnostic testing is negative, the possibility of a false negative result should be considered in the context of a patient's recent exposures and the presence of clinical signs and symptoms consistent with COVID-19. An individual without symptoms of COVID-19 and who is not shedding SARS-CoV-2 virus would expect to have a negative (not detected) result in this assay. Performed  At: [facility] [address] [place], [place] [id number] Jolene SchimkeNagendra Sanjai MD NW:[phone]    Coronavirus Source NASOPHARYNGEAL  Final    Comment: Performed at [facility], [address] [place], [place] [address]     Time coordinating discharge:  37 minutes  SIGNED:   Alwyn RenElizabeth G Keshawn Fiorito, MD  [facility] 09/25/2018, 9:00 AM Pager   If 7PM-7AM, please contact night-coverage [email or web address] Password [email or web address]

## 2018-09-27 ENCOUNTER — Other Ambulatory Visit: Payer: Self-pay | Admitting: Medical

## 2018-09-27 DIAGNOSIS — G459 Transient cerebral ischemic attack, unspecified: Secondary | ICD-10-CM

## 2018-09-27 MED FILL — ATORVASTATIN 40 MG TABLET: 40 | 30 days supply | Qty: 30 | Fill #0

## 2018-09-27 MED FILL — SIMVASTATIN 40 MG TABLET: 40 | 30 days supply | Qty: 30 | Fill #0

## 2018-09-28 LAB — URINE CULTURE: Culture: >=100000 — AB

## 2018-09-28 MED FILL — CEPHALEXIN 500 MG CAPSULE: 500 | 3 days supply | Qty: 6 | Fill #0

## 2018-09-29 ENCOUNTER — Telehealth: Payer: Self-pay | Admitting: Radiology

## 2018-09-29 NOTE — Telephone Encounter (Signed)
Enrolled patient for a 30 day [employer] Event Monitor to be mailed. Brief instructions were gone over with patients son in law and he knows to expect the monitor to arrive in 3-4 days

## 2018-10-06 ENCOUNTER — Ambulatory Visit (INDEPENDENT_AMBULATORY_CARE_PROVIDER_SITE_OTHER): Payer: Medicare Other

## 2018-10-06 DIAGNOSIS — I4891 Unspecified atrial fibrillation: Secondary | ICD-10-CM

## 2018-10-06 DIAGNOSIS — R42 Dizziness and giddiness: Secondary | ICD-10-CM

## 2018-10-06 DIAGNOSIS — G459 Transient cerebral ischemic attack, unspecified: Secondary | ICD-10-CM | POA: Diagnosis not present

## 2018-10-14 ENCOUNTER — Other Ambulatory Visit: Payer: Self-pay

## 2018-10-14 ENCOUNTER — Emergency Department (INDEPENDENT_AMBULATORY_CARE_PROVIDER_SITE_OTHER)
Admission: EM | Admit: 2018-10-14 | Discharge: 2018-10-14 | Disposition: A | Discharge status: Home / Self Care | Payer: Medicare Other | Source: Home / Self Care | Attending: Emergency Medicine | Admitting: Emergency Medicine

## 2018-10-14 ENCOUNTER — Encounter: Payer: Self-pay | Admitting: Emergency Medicine

## 2018-10-14 DIAGNOSIS — J029 Acute pharyngitis, unspecified: Secondary | ICD-10-CM | POA: Diagnosis not present

## 2018-10-14 DIAGNOSIS — R9431 Abnormal electrocardiogram [ECG] [EKG]: Secondary | ICD-10-CM

## 2018-10-14 DIAGNOSIS — H9201 Otalgia, right ear: Secondary | ICD-10-CM | POA: Diagnosis not present

## 2018-10-14 DIAGNOSIS — I499 Cardiac arrhythmia, unspecified: Secondary | ICD-10-CM | POA: Diagnosis not present

## 2018-10-14 DIAGNOSIS — Z8673 Personal history of transient ischemic attack (TIA), and cerebral infarction without residual deficits: Secondary | ICD-10-CM

## 2018-10-14 DIAGNOSIS — Z20822 Contact with and (suspected) exposure to covid-19: Secondary | ICD-10-CM

## 2018-10-14 DIAGNOSIS — R8281 Pyuria: Secondary | ICD-10-CM

## 2018-10-14 DIAGNOSIS — R05 Cough: Secondary | ICD-10-CM | POA: Diagnosis not present

## 2018-10-14 LAB — POCT URINALYSIS DIP (MANUAL ENTRY)
Bilirubin, UA: NEGATIVE
Blood, UA: NEGATIVE
Glucose, UA: NEGATIVE mg/dL
Ketones, POC UA: NEGATIVE mg/dL
Nitrite, UA: POSITIVE — AB
Protein Ur, POC: NEGATIVE mg/dL
Spec Grav, UA: 1.02 (ref 1.010–1.025)
Urobilinogen, UA: 0.2 E.U./dL
pH, UA: 5.5 (ref 5.0–8.0)

## 2018-10-14 LAB — POCT RAPID STREP A (OFFICE): Rapid Strep A Screen: NEGATIVE

## 2018-10-14 MED ORDER — CEPHALEXIN 250 MG PO CAPS: 250.0000 mg | ORAL_CAPSULE | Freq: Two times a day (BID) | ORAL | 0 refills | Status: DC

## 2018-10-14 MED ORDER — MUPIROCIN CALCIUM 2 % EX CREA: 1.0000 "application " | TOPICAL_CREAM | Freq: Three times a day (TID) | CUTANEOUS | 0 refills | Status: AC

## 2018-10-14 MED FILL — CEPHALEXIN 250 MG CAPSULE: 250 | 5 days supply | Qty: 10 | Fill #0

## 2018-10-14 MED FILL — MUPIROCIN 2% OINTMENT: 2 | 10 days supply | Qty: 22 | Fill #0

## 2018-10-14 NOTE — Discharge Instructions (Addendum)
Please watch for blisters around the right ear. Apply antibiotic ointment to the area of tenderness 3 times a day. Please follow the instructions on your COVID sheet. Please follow-up with your cardiologist regarding your irregular heart rhythm and EKG. Try warm compresses to the right ear and leave your right ear hearing aid out for a few days.

## 2018-10-14 NOTE — ED Triage Notes (Signed)
Bi-lateral ear pain, rt worse, headche x 1 day

## 2018-10-14 NOTE — ED Provider Notes (Signed)
[facility]    CSN: [id number] Arrival date & time: 10/14/18  8338     History   Chief Complaint Chief Complaint  Patient presents with  . Otalgia    HPI Cheynne Virden is a 85 y.o. female.  Patient presents with onset last night of pain and discomfort in her right ear.  This moves down the soft tissues just beneath the earlobe.  She has not had a rash in the area.  She does wear bilateral hearing aids and has some discomfort when putting her hearing aid in.  She has mild discomfort in her throat she does have mild rhinorrhea with a dry cough.  She had COVID testing done on July 2 prior to her hospitalization with a TIA.  This has improved significantly.  He is essentially homebound but does have visits from physical therapy and does have family members who leave the house.  She has done well with her stroke symptoms.  She was noted at that time to have frequent PACs and currently is wearing a Holter monitor.  Also of note she was noted to have a urinary tract infection while in the hospital which required subsequent treatment with cephalexin for a culture positive Klebsiella.  Otalgia Associated symptoms: congestion, cough and sore throat   Associated symptoms: no fever     Past Medical History:  Diagnosis Date  . Depression   . Hypertension   . Stroke [facility])     Patient Active Problem List   Diagnosis Date Noted  . TIA (transient ischemic attack)   . CVA (cerebral vascular accident) ([place]) 09/23/2018  . HTN (hypertension) 09/23/2018  . HLD (hyperlipidemia) 09/23/2018  . Renal insufficiency 09/23/2018  . Depression 09/23/2018  . Abnormal urinalysis 09/23/2018    Past Surgical History:  Procedure Laterality Date  . BACK SURGERY    . CHOLECYSTECTOMY    . JOINT REPLACEMENT      OB History   No obstetric history on file.      Home Medications    Prior to Admission medications   Medication Sig Start Date End Date Taking? Authorizing Provider   acetaminophen (TYLENOL) 325 MG tablet Take 650 mg by mouth every 6 (six) hours as needed for mild pain or headache.    [provider]  aspirin 81 MG chewable tablet Chew by mouth daily.    [provider]  atorvastatin (LIPITOR) 40 MG tablet Take 1 tablet (40 mg total) by mouth daily. 09/25/18 09/25/19  Georgette Shell, MD  cephALEXin (KEFLEX) 250 MG capsule Take 1 capsule (250 mg total) by mouth 2 (two) times daily. 10/14/18   Darlyne Russian, MD  cholecalciferol (VITAMIN D3) 25 MCG (1000 UT) tablet Take 1,000 Units by mouth daily.    [provider]  clopidogrel (PLAVIX) 75 MG tablet Take 1 tablet (75 mg total) by mouth daily. Take aspirin and Plavix together for 3 weeks and then take only Plavix 09/25/18   Georgette Shell, MD  docusate sodium (COLACE) 100 MG capsule Take 100 mg by mouth as needed for mild constipation.    [provider]  donepezil (ARICEPT) 10 MG tablet Take 10 mg by mouth at bedtime.    [provider]  DULoxetine (CYMBALTA) 60 MG capsule Take 60 mg by mouth 2 (two) times daily.     [provider]  losartan (COZAAR) 100 MG tablet Take 100 mg by mouth daily.    [provider]  metoprolol tartrate (LOPRESSOR) 25  MG tablet Take 12.5 mg by mouth 2 (two) times daily.     [provider]  mupirocin cream (BACTROBAN) 2 % Apply 1 application topically 3 (three) times daily. Apply to entrance to right ear canal 3 times a day.  May substitute ointment. 10/14/18   Collene Gobbleaub, Yazlin Ekblad A, MD  omeprazole (PRILOSEC) 40 MG capsule Take 40 mg by mouth daily.     [provider]  tolterodine (DETROL LA) 4 MG 24 hr capsule Take 4 mg by mouth daily.    [provider]  triamterene-hydrochlorothiazide (MAXZIDE-25) 37.5-25 MG tablet Take 1 tablet by mouth daily.    [provider]    Family History Family History  Problem Relation Age of Onset  . Hypertension Mother   . Hypertension Father      Social History Social History   Tobacco Use  . Smoking status: Never Smoker  . Smokeless tobacco: Never Used  Substance Use Topics  . Alcohol use: No  . Drug use: Not on file     Allergies   Oxycodone   Review of Systems Review of Systems  Constitutional: Positive for chills. Negative for fever.  HENT: Positive for congestion, ear pain and sore throat.        She has discomfort in her right ear at the entrance to the ear canal and has discomfort when putting in her hearing aid  Eyes: Negative.   Respiratory: Positive for cough. Negative for chest tightness and wheezing.   Cardiovascular:       She is currently wearing a Holter monitor and when in the hospital she was found to have PACs  Gastrointestinal: Negative.   Genitourinary:       Denies urinary symptoms but took a course of cephalexin following a diagnosed urinary tract infection while in the hospital.  Neurological:       Patient hospitalized July 2.  She was felt to have had a cerebrovascular accident.  She was not treated with TPA she was hospitalized 3 days and released.  She is currently on Plavix and aspirin due to stop her aspirin today.  Hematological: Negative.   Psychiatric/Behavioral:       She has a long history of severe depression currently on Cymbalta     Physical Exam Triage Vital Signs ED Triage Vitals  Enc Vitals Group     BP      Pulse      Resp      Temp      Temp src      SpO2      Weight      Height      Head Circumference      Peak Flow      Pain Score      Pain Loc      Pain Edu?      Excl. in GC?    No data found.  Updated Vital Signs BP (!) 147/89 (BP Location: Right Arm)   Pulse 78   Temp 98.5 F (36.9 C) (Oral)   Resp 18   SpO2 96%   Visual Acuity Right Eye Distance:   Left Eye Distance:   Bilateral Distance:    Right Eye Near:   Left Eye Near:    Bilateral Near:     Physical Exam Constitutional:      General: She is not in acute distress.    Appearance:  Normal appearance. She is not toxic-appearing or diaphoretic.  HENT:     Head:  Normocephalic.     Right Ear: Tympanic membrane normal.     Left Ear: Tympanic membrane normal.     Ears:     Comments: There are bilateral hearing aids in.  There is tenderness at the entrance to the right external auditory canal but no herpetic lesions are seen around the ear or right side of the neck.    Nose:     Comments: She has a clear rhinorrhea noted    Mouth/Throat:     Mouth: Mucous membranes are moist.  Eyes:     Comments: Pupils are pinpoint.  Neck:     Musculoskeletal: Neck supple.  Cardiovascular:     Comments: Heart rhythm is very irregular Pulmonary:     Effort: Pulmonary effort is normal.     Breath sounds: Normal breath sounds.  Lymphadenopathy:     Cervical: No cervical adenopathy.  Neurological:     Mental Status: She is alert.     Comments: Minimal weakness of the right arm.  Significantly better from what is described in the chart on admission July 2.  Psychiatric:        Mood and Affect: Mood normal.        Behavior: Behavior normal.      UC Treatments / Results  Labs (all labs ordered are listed, but only abnormal results are displayed) Labs Reviewed  URINE CULTURE  SARS-COV-2 RNA, QUALITATIVE REAL-TIME RT-PCR  POCT URINALYSIS DIP (MANUAL ENTRY)  POCT RAPID STREP A (OFFICE)    EKG Sinus rhythm with premature ventricular contractions.  There are Q waves present in lead III and aVF due to possible previous inferior infarct.  There is also a loss of the R wave in V3 suspicious for possible previous anterior infarct.  There are no ST-T changes except for T inversion in lead aVL.  Radiology No results found.  Procedures Procedures (including critical care time)  Medications Ordered in UC Medications - No data to display  Initial Impression / Assessment and Plan / UC Course  I have reviewed the triage vital signs and the nursing notes. It does appear she has a  recurrent urinary tract infection.  They were given a prescription for cephalexin to take twice a day for 5 days.  Repeat culture was done.  Will use Bactroban ointment to the entrance to the right ear canal which is slightly tender to touch.  A throat culture was done as well as a COVID culture.  She will use Tylenol for pain.  She can use a heating pad and a prescription for Bactroban was printed.  They will watch for developing blisters.  She had an echocardiogram done while she was in the hospital on 09/24/2018.  This showed diastolic dysfunction but no regional wall abnormalities and an ejection fraction of 65%. Pertinent labs & imaging results that were available during my care of the patient were reviewed by me and considered in my medical decision making (see chart for details).      Final Clinical Impressions(s) / UC Diagnoses   Final diagnoses:  Right ear pain  Nonspecific abnormal electrocardiogram (ECG) (EKG)  Pyuria  Status post CVA  Suspected Covid-19 Virus Infection     Discharge Instructions     Please watch for blisters around the right ear. Apply antibiotic ointment to the area of tenderness 3 times a day. Please follow the instructions on your COVID sheet. Please follow-up with your cardiologist regarding your irregular heart rhythm and EKG. Try warm compresses to the  right ear and leave your right ear hearing aid out for a few days.    ED Prescriptions    Medication Sig Dispense Auth. Provider   mupirocin cream (BACTROBAN) 2 % Apply 1 application topically 3 (three) times daily. Apply to entrance to right ear canal 3 times a day.  May substitute ointment. 15 g Collene Gobbleaub, Tariyah Pendry A, MD   cephALEXin (KEFLEX) 250 MG capsule Take 1 capsule (250 mg total) by mouth 2 (two) times daily. 10 capsule Collene Gobbleaub, Navarre Diana A, MD     Controlled Substance Prescriptions [place] Controlled Substance Registry consulted? Not Applicable   Collene Gobbleaub, Abbee Cremeens A, MD 10/14/18 [phone]

## 2018-10-15 LAB — STREP A DNA PROBE: Group A Strep Probe: NOT DETECTED

## 2018-10-16 ENCOUNTER — Telehealth: Payer: Self-pay | Admitting: Emergency Medicine

## 2018-10-16 LAB — URINE CULTURE
MICRO NUMBER:: 697226
SPECIMEN QUALITY:: ADEQUATE

## 2018-10-16 NOTE — Telephone Encounter (Signed)
Patient's son-in-law states patient much improved; reported to him that here Strep DNA was negative and we will call when other labs has resulted.

## 2018-10-17 NOTE — Telephone Encounter (Signed)
Called patient with UCX results and to complete entire course of antibiotic.

## 2018-10-19 ENCOUNTER — Telehealth: Payer: Self-pay | Admitting: Internal Medicine

## 2018-10-19 NOTE — Telephone Encounter (Signed)
Cardiology Moonlighter Note  Returned page from [place] on critical EKG. Had first documented episode of atrial fibrillation around 8pm central time. HR around 100 bpm. Patient was contacted, asymptomatic. Was eating dinner at the time. Converted back to NSR.  Marcie Mowers, MD Cardiology Fellow, PGY-7

## 2018-10-20 ENCOUNTER — Telehealth: Payer: Self-pay | Admitting: *Deleted

## 2018-10-20 ENCOUNTER — Other Ambulatory Visit: Payer: Self-pay

## 2018-10-20 ENCOUNTER — Ambulatory Visit (HOSPITAL_COMMUNITY)
Admission: RE | Admit: 2018-10-20 | Discharge: 2018-10-20 | Disposition: A | Discharge status: Home / Self Care | Payer: Medicare Other | Source: Ambulatory Visit | Attending: Physician Assistant | Admitting: Physician Assistant

## 2018-10-20 DIAGNOSIS — I480 Paroxysmal atrial fibrillation: Secondary | ICD-10-CM | POA: Diagnosis not present

## 2018-10-20 MED ORDER — APIXABAN 5 MG PO TABS: 5.0000 mg | ORAL_TABLET | Freq: Two times a day (BID) | ORAL | 1 refills | Status: AC

## 2018-10-20 MED FILL — ELIQUIS 5 MG TABLET: 5 | 30 days supply | Qty: 60 | Fill #0

## 2018-10-20 NOTE — Telephone Encounter (Signed)
Received [employer] monitor report Day 14, critical Atrial fibrillation sustained, 100 bpm at 5:50 pm cst  Monitor company contacted pt.  Per report she was asymptomatic, home eating dinner. Cardiology moonlighter was contacted by monitor company who also contacted patient. See previous telephone encounter from 10/19/18  Pt was discharged on asa, plavix with instructions to stop asa after three weeks and continue plavix. Reviewed with Dr. Caryl Comes who was consulted in hospital. He requests report of current rhythm. He has reached out to the patient/daughter.

## 2018-10-20 NOTE — Progress Notes (Signed)
Electrophysiology TeleHealth Note   Due to national recommendations of social distancing due to [place] 19, Audio/video telehealth visit is felt to be most appropriate for this patient at this time.  See consent below from today for patient consent regarding telehealth for the [facility]. Consent obtained verbally.   Date:  10/20/2018   ID:  Megan Livingston, DOB [birth year], MRN [id number]  Location: home  Provider location: [address] [place], [place] [address] Evaluation Performed: Follow up  PCP:  System, Pcp Not In  Primary Electrophysiologist: Dr Caryl Comes   CC: Follow up for new onset atrial fibrillation   History of Present Illness: Megan Livingston is a 85 y.o. female who presents via audio/video conferencing for a telehealth visit today. Patient was recently hospitalized with acute TIA. She was noted to have frequent atrial ectopy and was given a 30 day monitor to screen for afib. On 10/19/18, she was noted to have afib HR 100s around dinner time which then converted back to SR. There were no triggering factors that the patient could identify. She has been on DAPT for 3 weeks and Plavix only since 10/16/18. She was asymptomatic during the episode.  Today, she denies symptoms of palpitations, chest pain, shortness of breath, orthopnea, PND, lower extremity edema, claudication, dizziness, presyncope, syncope, bleeding, or neurologic sequela. The patient is tolerating medications without difficulties and is otherwise without complaint today.   she denies symptoms of cough, fevers, chills, or new SOB worrisome for COVID 19.     Atrial Fibrillation Risk Factors:  she does not have symptoms or diagnosis of sleep apnea. The patient does have a history of early familial atrial fibrillation or other arrhythmias.  she has a BMI of There is no height or weight on file to calculate BMI.. There were no vitals filed for this visit.  Past Medical History:  Diagnosis Date   . Depression   . Hypertension   . Stroke [facility])    Past Surgical History:  Procedure Laterality Date  . BACK SURGERY    . CHOLECYSTECTOMY    . JOINT REPLACEMENT       Current Outpatient Medications  Medication Sig Dispense Refill  . acetaminophen (TYLENOL) 325 MG tablet Take 650 mg by mouth every 6 (six) hours as needed for mild pain or headache.    Marland Kitchen apixaban (ELIQUIS) 5 MG TABS tablet Take 1 tablet (5 mg total) by mouth 2 (two) times daily. 60 tablet 1  . atorvastatin (LIPITOR) 40 MG tablet Take 1 tablet (40 mg total) by mouth daily. 30 tablet 11  . cephALEXin (KEFLEX) 250 MG capsule Take 1 capsule (250 mg total) by mouth 2 (two) times daily. 10 capsule 0  . cholecalciferol (VITAMIN D3) 25 MCG (1000 UT) tablet Take 1,000 Units by mouth daily.    Marland Kitchen docusate sodium (COLACE) 100 MG capsule Take 100 mg by mouth as needed for mild constipation.    Marland Kitchen donepezil (ARICEPT) 10 MG tablet Take 10 mg by mouth at bedtime.    . DULoxetine (CYMBALTA) 60 MG capsule Take 60 mg by mouth 2 (two) times daily.     Marland Kitchen losartan (COZAAR) 100 MG tablet Take 100 mg by mouth daily.    . metoprolol tartrate (LOPRESSOR) 25 MG tablet Take 12.5 mg by mouth 2 (two) times daily.     . mupirocin cream (BACTROBAN) 2 % Apply 1 application topically 3 (three) times daily. Apply to entrance to right ear canal 3 times a day.  May substitute ointment. 15 g 0  . omeprazole (PRILOSEC) 40 MG capsule Take 40 mg by mouth daily.     Marland Kitchen. tolterodine (DETROL LA) 4 MG 24 hr capsule Take 4 mg by mouth daily.    Marland Kitchen. triamterene-hydrochlorothiazide (MAXZIDE-25) 37.5-25 MG tablet Take 1 tablet by mouth daily.     No current facility-administered medications for this encounter.     Allergies:   Oxycodone   Social History:  The patient  reports that she has never smoked. She has never used smokeless tobacco. She reports that she does not drink alcohol.   Family History:  The patient's  family history includes Hypertension in her father  and mother.    ROS:  Please see the history of present illness.   All other systems are personally reviewed and negative.   Exam: Well appearing, alert and conversant, regular work of breathing,  good skin color  Recent Labs: 09/23/2018: ALT 17; BUN 25; Creatinine, Ser 1.10; Hemoglobin 14.6; Platelets 213; Potassium 4.5; Sodium 139  personally reviewed    Other studies personally reviewed: Additional studies/ records that were reviewed today include: Epic notes, echocardiogram   Echo 09/24/18 1. The left ventricle has hyperdynamic systolic function, with an ejection fraction of >65%. The cavity size was normal. Left ventricular diastolic Doppler parameters are consistent with impaired relaxation. No evidence of left ventricular regional wall  motion abnormalities.  2. The right ventricle has normal systolic function. The cavity was normal. There is no increase in right ventricular wall thickness.  3. The mitral valve is abnormal. Mild thickening of the mitral valve leaflet. Mild calcification of the mitral valve leaflet.  4. The tricuspid valve is grossly normal.  5. The aortic valve is tricuspid. Mild calcification of the aortic valve. No stenosis of the aortic valve.  SUMMARY   LVEF >65%, normal wall thickness, normal wall motion, grade 1 DD, normal LV filling pressure, normal LA size, negative for PFO by color doppler   ASSESSMENT AND PLAN:  1. New onset paroxysmal atrial fibrillation Found on 30 day monitor s/p TIA. General education about afib discussed and questions answered.  We also discussed her stroke risk and the risks and benefits of anticoagulation. Will stop Plavix and start Eliquis 5 mg BID Patient still wearing cardiac monitor. Will review AF burden and rate control once completed.   This patients CHA2DS2-VASc Score and unadjusted Ischemic Stroke Rate (% per year) is equal to 9.7 % stroke rate/year from a score of 6  Above score calculated as 1 point each if  present [CHF, HTN, DM, Vascular=MI/PAD/Aortic Plaque, Age if 65-74, or Female] Above score calculated as 2 points each if present [Age > 75, or Stroke/TIA/TE]  2. HTN No changes today.   COVID screen The patient does not have any symptoms that suggest any further testing/ screening at this time.  Social distancing reinforced today.    Follow-up in AF clinic in 4 weeks. Patient is moving to [place] soon. Will forward records once she establishes with cardiology there.  Current medicines are reviewed at length with the patient today.   The patient does not have concerns regarding her medicines.  The following changes were made today:  Stop Plavix, start Eliquis  Labs/ tests ordered today include:  No orders of the defined types were placed in this encounter.   Patient Risk:  after full review of this patients clinical status, I feel that they are at moderate risk at this time.   Today, I have spent  18 minutes with the patient with telehealth technology discussing the above.    Dalia HeadingSigned, Ricky Fenton PA-C 10/20/2018 4:47 PM  Afib Clinic [facility] [address] [place], [place] [address] [phone]   I hereby voluntarily request, consent and authorize the Atrial Fibrillation Clinic and its employed or contracted physicians, physician assistants, nurse practitioners or other licensed health care professionals (the Practitioner), to provide me with telemedicine health care services (the "Services") as deemed necessary by the treating Practitioner. I acknowledge and consent to receive the Services by the Practitioner via telemedicine. I understand that the telemedicine visit will involve communicating with the Practitioner through live audiovisual communication technology and the disclosure of certain medical information by electronic transmission. I acknowledge that I have been given the opportunity to request an in-person assessment or other available alternative prior to  the telemedicine visit and am voluntarily participating in the telemedicine visit.   I understand that I have the right to withhold or withdraw my consent to the use of telemedicine in the course of my care at any time, without affecting my right to future care or treatment, and that the Practitioner or I may terminate the telemedicine visit at any time. I understand that I have the right to inspect all information obtained and/or recorded in the course of the telemedicine visit and may receive copies of available information for a reasonable fee.  I understand that some of the potential risks of receiving the Services via telemedicine include:   Delay or interruption in medical evaluation due to technological equipment failure or disruption;  Information transmitted may not be sufficient (e.g. poor resolution of images) to allow for appropriate medical decision making by the Practitioner; and/or  In rare instances, security protocols could fail, causing a breach of personal health information.   Furthermore, I acknowledge that it is my responsibility to provide information about my medical history, conditions and care that is complete and accurate to the best of my ability. I acknowledge that Practitioner's advice, recommendations, and/or decision may be based on factors not within their control, such as incomplete or inaccurate data provided by me or distortions of diagnostic images or specimens that may result from electronic transmissions. I understand that the practice of medicine is not an exact science and that Practitioner makes no warranties or guarantees regarding treatment outcomes. I acknowledge that I will receive a copy of this consent concurrently upon execution via email to the email address I last provided but may also request a printed copy by calling the office of the At[facility] Fibrillation Clinic.  I understand that my insurance will be billed for this visit.   I have read or had this  consent read to me.  I understand the contents of this consent, which adequately explains the benefits and risks of the Services being provided via telemedicine.  I have been provided ample opportunity to ask questions regarding this consent and the Services and have had my questions answered to my satisfaction.  I give my informed consent for the services to be provided through the use of telemedicine in my medical care  By participating in this telemedicine visit I agree to the above.

## 2018-10-21 ENCOUNTER — Encounter: Payer: Self-pay | Admitting: Neurology

## 2018-10-21 ENCOUNTER — Other Ambulatory Visit: Payer: Self-pay

## 2018-10-21 ENCOUNTER — Ambulatory Visit (INDEPENDENT_AMBULATORY_CARE_PROVIDER_SITE_OTHER): Payer: Medicare Other | Admitting: Neurology

## 2018-10-21 ENCOUNTER — Other Ambulatory Visit: Payer: Self-pay | Admitting: Neurology

## 2018-10-21 VITALS — BP 140/68 | HR 65 | Temp 97.8°F | Wt 195.4 lb

## 2018-10-21 DIAGNOSIS — G3184 Mild cognitive impairment, so stated: Secondary | ICD-10-CM

## 2018-10-21 DIAGNOSIS — G459 Transient cerebral ischemic attack, unspecified: Secondary | ICD-10-CM | POA: Diagnosis not present

## 2018-10-21 DIAGNOSIS — I480 Paroxysmal atrial fibrillation: Secondary | ICD-10-CM | POA: Diagnosis not present

## 2018-10-21 NOTE — Patient Instructions (Signed)
I had a long d/w patient and her son in law about her recent TIA, prior lacunar strokes , recent diagnosis of atrial fibrillation, risk for recurrent stroke/TIAs, personally independently reviewed imaging studies and stroke evaluation results and answered questions.Continue Eliquis (apixaban) daily  for secondary stroke prevention and maintain strict control of hypertension with blood pressure goal below 130/90, diabetes with hemoglobin A1c goal below 6.5% and lipids with LDL cholesterol goal below 70 mg/dL. I also advised the patient us her walker at all times and discussed fall safety precautions. Continue Aricept 10 mg daily for her mild cognitive impairment. She will be moving to [place] soon and will f/u with her primary physician there .She needs close monitoring of her renal function to adjust eliquis dose.Followup in the future with me in future only as needed.  Fall Prevention in the Home, Adult Falls can cause injuries. They can happen to people of all ages. There are many things you can do to make your home safe and to help prevent falls. Ask for help when making these changes, if needed. What actions can I take to prevent falls? General Instructions  Use good lighting in all rooms. Replace any light bulbs that burn out.  Turn on the lights when you go into a dark area. Use night-lights.  Keep items that you use often in easy-to-reach places. Lower the shelves around your home if necessary.  Set up your furniture so you have a clear path. Avoid moving your furniture around.  Do not have throw rugs and other things on the floor that can make you trip.  Avoid walking on wet floors.  If any of your floors are uneven, fix them.  Add color or contrast paint or tape to clearly mark and help you see: ? Any grab bars or handrails. ? First and last steps of stairways. ? Where the edge of each step is.  If you use a stepladder: ? Make sure that it is fully opened. Do not climb a closed  stepladder. ? Make sure that both sides of the stepladder are locked into place. ? Ask someone to hold the stepladder for you while you use it.  If there are any pets around you, be aware of where they are. What can I do in the bathroom?      Keep the floor dry. Clean up any water that spills onto the floor as soon as it happens.  Remove soap buildup in the tub or shower regularly.  Use non-skid mats or decals on the floor of the tub or shower.  Attach bath mats securely with double-sided, non-slip rug tape.  If you need to sit down in the shower, use a plastic, non-slip stool.  Install grab bars by the toilet and in the tub and shower. Do not use towel bars as grab bars. What can I do in the bedroom?  Make sure that you have a light by your bed that is easy to reach.  Do not use any sheets or blankets that are too big for your bed. They should not hang down onto the floor.  Have a firm chair that has side arms. You can use this for support while you get dressed. What can I do in the kitchen?  Clean up any spills right away.  If you need to reach something above you, use a strong step stool that has a grab bar.  Keep electrical cords out of the way.  Do not use floor polish or wax  that makes floors slippery. If you must use wax, use non-skid floor wax. What can I do with my stairs?  Do not leave any items on the stairs.  Make sure that you have a light switch at the top of the stairs and the bottom of the stairs. If you do not have them, ask someone to add them for you.  Make sure that there are handrails on both sides of the stairs, and use them. Fix handrails that are broken or loose. Make sure that handrails are as long as the stairways.  Install non-slip stair treads on all stairs in your home.  Avoid having throw rugs at the top or bottom of the stairs. If you do have throw rugs, attach them to the floor with carpet tape.  Choose a carpet that does not hide the  edge of the steps on the stairway.  Check any carpeting to make sure that it is firmly attached to the stairs. Fix any carpet that is loose or worn. What can I do on the outside of my home?  Use bright outdoor lighting.  Regularly fix the edges of walkways and driveways and fix any cracks.  Remove anything that might make you trip as you walk through a door, such as a raised step or threshold.  Trim any bushes or trees on the path to your home.  Regularly check to see if handrails are loose or broken. Make sure that both sides of any steps have handrails.  Install guardrails along the edges of any raised decks and porches.  Clear walking paths of anything that might make someone trip, such as tools or rocks.  Have any leaves, snow, or ice cleared regularly.  Use sand or salt on walking paths during winter.  Clean up any spills in your garage right away. This includes grease or oil spills. What other actions can I take?  Wear shoes that: ? Have a low heel. Do not wear high heels. ? Have rubber bottoms. ? Are comfortable and fit you well. ? Are closed at the toe. Do not wear open-toe sandals.  Use tools that help you move around (mobility aids) if they are needed. These include: ? Canes. ? Walkers. ? Scooters. ? Crutches.  Review your medicines with your doctor. Some medicines can make you feel dizzy. This can increase your chance of falling. Ask your doctor what other things you can do to help prevent falls. Where to find more information  Centers for Disease Control and Prevention, STEADI: HealthcareCounselor.com.pthttps://cdc.gov  [employer] on Aging: RingConnections.sihttps://go4life.nia.nih.gov Contact a doctor if:  You are afraid of falling at home.  You feel weak, drowsy, or dizzy at home.  You fall at home. Summary  There are many simple things that you can do to make your home safe and to help prevent falls.  Ways to make your home safe include removing tripping hazards and installing grab  bars in the bathroom.  Ask for help when making these changes in your home. This information is not intended to replace advice given to you by your health care provider. Make sure you discuss any questions you have with your health care provider. Document Released: 01/04/2009 Document Revised: 07/01/2018 Document Reviewed: 10/23/2016 Elsevier Patient Education  2020 [employer]  Stroke Prevention Some medical conditions and behaviors are associated with a higher chance of having a stroke. You can help prevent a stroke by making nutrition, lifestyle, and other changes, including managing any medical conditions you may have.  What nutrition changes can be made?   Eat healthy foods. You can do this by: ? Choosing foods high in fiber, such as fresh fruits and vegetables and whole grains. ? Eating at least 5 or more servings of fruits and vegetables a day. Try to fill half of your plate at each meal with fruits and vegetables. ? Choosing lean protein foods, such as lean cuts of meat, poultry without skin, fish, tofu, beans, and nuts. ? Eating low-fat dairy products. ? Avoiding foods that are high in salt (sodium). This can help lower blood pressure. ? Avoiding foods that have saturated fat, trans fat, and cholesterol. This can help prevent high cholesterol. ? Avoiding processed and premade foods.  Follow your health care provider's specific guidelines for losing weight, controlling high blood pressure (hypertension), lowering high cholesterol, and managing diabetes. These may include: ? Reducing your daily calorie intake. ? Limiting your daily sodium intake to 1,500 milligrams (mg). ? Using only healthy fats for cooking, such as olive oil, canola oil, or sunflower oil. ? Counting your daily carbohydrate intake. What lifestyle changes can be made?  Maintain a healthy weight. Talk to your health care provider about your ideal weight.  Get at least 30 minutes of moderate physical activity at  least 5 days a week. Moderate activity includes brisk walking, biking, and swimming.  Do not use any products that contain nicotine or tobacco, such as cigarettes and e-cigarettes. If you need help quitting, ask your health care provider. It may also be helpful to avoid exposure to secondhand smoke.  Limit alcohol intake to no more than 1 drink a day for nonpregnant women and 2 drinks a day for men. One drink equals 12 oz of beer, 5 oz of wine, or 1 oz of hard liquor.  Stop any illegal drug use.  Avoid taking birth control pills. Talk to your health care provider about the risks of taking birth control pills if: ? You are over 78 years old. ? You smoke. ? You get migraines. ? You have ever had a blood clot. What other changes can be made?  Manage your cholesterol levels. ? Eating a healthy diet is important for preventing high cholesterol. If cholesterol cannot be managed through diet alone, you may also need to take medicines. ? Take any prescribed medicines to control your cholesterol as told by your health care provider.  Manage your diabetes. ? Eating a healthy diet and exercising regularly are important parts of managing your blood sugar. If your blood sugar cannot be managed through diet and exercise, you may need to take medicines. ? Take any prescribed medicines to control your diabetes as told by your health care provider.  Control your hypertension. ? To reduce your risk of stroke, try to keep your blood pressure below 130/80. ? Eating a healthy diet and exercising regularly are an important part of controlling your blood pressure. If your blood pressure cannot be managed through diet and exercise, you may need to take medicines. ? Take any prescribed medicines to control hypertension as told by your health care provider. ? Ask your health care provider if you should monitor your blood pressure at home. ? Have your blood pressure checked every year, even if your blood pressure  is normal. Blood pressure increases with age and some medical conditions.  Get evaluated for sleep disorders (sleep apnea). Talk to your health care provider about getting a sleep evaluation if you snore a lot or have excessive sleepiness.  Take over-the-counter  and prescription medicines only as told by your health care provider. Aspirin or blood thinners (antiplatelets or anticoagulants) may be recommended to reduce your risk of forming blood clots that can lead to stroke.  Make sure that any other medical conditions you have, such as atrial fibrillation or atherosclerosis, are managed. What are the warning signs of a stroke? The warning signs of a stroke can be easily remembered as BEFAST.  B is for balance. Signs include: ? Dizziness. ? Loss of balance or coordination. ? Sudden trouble walking.  E is for eyes. Signs include: ? A sudden change in vision. ? Trouble seeing.  F is for face. Signs include: ? Sudden weakness or numbness of the face. ? The face or eyelid drooping to one side.  A is for arms. Signs include: ? Sudden weakness or numbness of the arm, usually on one side of the body.  S is for speech. Signs include: ? Trouble speaking (aphasia). ? Trouble understanding.  T is for time. ? These symptoms may represent a serious problem that is an emergency. Do not wait to see if the symptoms will go away. Get medical help right away. Call your local emergency services (911 in the [place] Do not drive yourself to the hospital.  Other signs of stroke may include: ? A sudden, severe headache with no known cause. ? Nausea or vomiting. ? Seizure. Where to find more information For more information, visit:  American Stroke Association: www.strokeassociation.org  National Stroke Association: www.stroke.org Summary  You can prevent a stroke by eating healthy, exercising, not smoking, limiting alcohol intake, and managing any medical conditions you may have.  Do not use  any products that contain nicotine or tobacco, such as cigarettes and e-cigarettes. If you need help quitting, ask your health care provider. It may also be helpful to avoid exposure to secondhand smoke.  Remember BEFAST for warning signs of stroke. Get help right away if you or a loved one has any of these signs. This information is not intended to replace advice given to you by your health care provider. Make sure you discuss any questions you have with your health care provider. Document Released: 04/17/2004 Document Revised: 02/20/2017 Document Reviewed: 04/15/2016 Elsevier Patient Education  2020 [employer]

## 2018-10-21 NOTE — Progress Notes (Addendum)
[facility] [address] [place]. [place] [address] [phone]       OFFICE CONSULT NOTE  Ms. Megan JamesMary Helen Livingston Date of Birth:  July 16, 1930 Medical Record Number:  [id number]   Referring MD: Marvel PlanJindong Xu  Reason for Referral: TIA HPI: [name] is a pleasant 85 year old Caucasian lady seen today for initial office consultation visit for TIA.  She is accompanied by her son-in-law.  History is obtained from them as well as review of electronic medical record.  I have personally reviewed imaging films in PACS.  She presented on 09/23/2018 with sudden onset of right hand weakness and clumsiness upon awakening.  She had trouble putting on a hearing aid.  Her symptoms began improving by the time she was brought in as a code stroke [facility].  She denied any accompanying headache, slurred speech, gait or balance problems or vision changes.  She does have prior history for couple of lacunar strokes in the past.  She had one 8 years ago when she had back surgery and woke up quite confused with generalized weakness.  Since then she has had some cognitive impairment and she has been on Aricept 10 mg daily for the last year or so which has shown improvement.  She needs some help to walk with a walker but mostly can do most activities of daily living for herself.  She is living at home with her daughter and spends about 629 months in [place] and 3 months [place].  Patient had stroke work-up in the hospital which include an MRI scan of the brain which showed no acute infarct but showed a couple of old bilateral basal ganglia lacunar infarcts as well as extensive changes of chronic small vessel disease including 6-10 small microhemorrhages.  MRI of the brain showed moderate to severe stenosis at the left P1 P2 junction and mild right ICA atherosclerosis.  2D echo showed normal ejection fraction without cardiac source of embolism.  Lower extremity venous  Dopplers were negative for DVT.  LDL cholesterol was optimal at 55 mg percent and hemoglobin A1c at 6.0.  She was on aspirin prior to admission and was discharged on dual antiplatelet therapy for 3 weeks.  She had significant atrial arrhythmias during monitoring and electrophysiology team was consulted and recommended 30-day heart monitor which she did.  She was found to have paroxysmal A. fib on 10/19/2018 and was seen in the A. fib clinic yesterday and started on Eliquis and took her first dose yesterday night.  Aspirin and Plavix have been discontinued.  Patient has not had any further recurrent stroke or TIA symptoms since she has been home.  She does use a wheeled walker at all times for walking and is careful and has not had any recent falls.  She is on Aricept 10 mg for cognitive impairment which appears to be stable.  She has no new complaints today. ROS:   14 system review of systems is positive for memory loss, gait imbalance, weakness, hearing loss and all other systems negative  PMH:  Past Medical History:  Diagnosis Date   Atrial fibrillation (HCC)    Depression    Hypertension    Stroke [facility]HCC)     Social History:  Social History   Socioeconomic History   Marital status: Single    Spouse name: Not on file   Number of children: Not on file   Years of education: Not on file   Highest education level:  Not on file  Occupational History   Not on file  Social Needs   Financial resource strain: Not on file   Food insecurity    Worry: Not on file    Inability: Not on file   Transportation needs    Medical: Not on file    Non-medical: Not on file  Tobacco Use   Smoking status: Never Smoker   Smokeless tobacco: Never Used  Substance and Sexual Activity   Alcohol use: No   Drug use: Not Currently   Sexual activity: Not on file  Lifestyle   Physical activity    Days per week: Not on file    Minutes per session: Not on file   Stress: Not on file    Relationships   Social connections    Talks on phone: Not on file    Gets together: Not on file    Attends religious service: Not on file    Active member of club or organization: Not on file    Attends meetings of clubs or organizations: Not on file    Relationship status: Not on file   Intimate partner violence    Fear of current or ex partner: Not on file    Emotionally abused: Not on file    Physically abused: Not on file    Forced sexual activity: Not on file  Other Topics Concern   Not on file  Social History Narrative   Not on file    Medications:   Current Outpatient Medications on File Prior to Visit  Medication Sig Dispense Refill   acetaminophen (TYLENOL) 325 MG tablet Take 650 mg by mouth every 6 (six) hours as needed for mild pain or headache.     apixaban (ELIQUIS) 5 MG TABS tablet Take 1 tablet (5 mg total) by mouth 2 (two) times daily. 60 tablet 1   atorvastatin (LIPITOR) 40 MG tablet Take 1 tablet (40 mg total) by mouth daily. 30 tablet 11   cephALEXin (KEFLEX) 250 MG capsule Take 1 capsule (250 mg total) by mouth 2 (two) times daily. 10 capsule 0   cholecalciferol (VITAMIN D3) 25 MCG (1000 UT) tablet Take 1,000 Units by mouth daily.     docusate sodium (COLACE) 100 MG capsule Take 100 mg by mouth as needed for mild constipation.     donepezil (ARICEPT) 10 MG tablet Take 10 mg by mouth at bedtime.     DULoxetine (CYMBALTA) 60 MG capsule Take 60 mg by mouth 2 (two) times daily.      losartan (COZAAR) 100 MG tablet Take 100 mg by mouth daily.     metoprolol tartrate (LOPRESSOR) 25 MG tablet Take 12.5 mg by mouth 2 (two) times daily.      mupirocin cream (BACTROBAN) 2 % Apply 1 application topically 3 (three) times daily. Apply to entrance to right ear canal 3 times a day.  May substitute ointment. 15 g 0   omeprazole (PRILOSEC) 40 MG capsule Take 40 mg by mouth daily.      tolterodine (DETROL LA) 4 MG 24 hr capsule Take 4 mg by mouth daily.      triamterene-hydrochlorothiazide (MAXZIDE-25) 37.5-25 MG tablet Take 1 tablet by mouth daily.     No current facility-administered medications on file prior to visit.     Allergies:   Allergies  Allergen Reactions   Oxycodone Other (See Comments)    hypotension    Physical Exam General: well developed, well nourished pleasant elderly Caucasian lady, seated, in no  evident distress Head: head normocephalic and atraumatic.   Neck: supple with no carotid or supraclavicular bruits Cardiovascular: regular rate and rhythm, no murmurs Musculoskeletal: no deformity Skin:  no rash/petichiae Vascular:  Normal pulses all extremities  Neurologic Exam Mental Status: Awake and fully alert. Oriented to place and time. Recent and remote memory intact. Attention span, concentration and fund of knowledge appropriate. Mood and affect appropriate.  Increased reaction time and mild psychomotor slowing.  Diminished recall 1/3.  Able to name 8 animals which can walk on 4 legs.  Clock drawing 3/4. Cranial Nerves: Fundoscopic exam not done pupils equal, briskly reactive to light. Extraocular movements full without nystagmus. Visual fields full to confrontation. Hearing diminished slightly bilaterally despite hearing aid.. Facial sensation intact. Face, tongue, palate moves normally and symmetrically.  Motor: Normal bulk and tone. Normal strength in all tested extremity muscles. Sensory.: intact to touch , pinprick , position and vibratory sensation.  Coordination: Rapid alternating movements normal in all extremities. Finger-to-nose and heel-to-shin performed accurately bilaterally. Gait and Station: Arises from chair with mild difficulty. Stance is slightly broad-based.  Walks with a slow cautious broad-based gait using a wheeled walker.  Reflexes: 1+ and symmetric. Toes downgoing.   NIHSS  0 Modified Rankin  3   ASSESSMENT: 85 year old Caucasian lady with left hemispheric TIA in July 2020 with recent  diagnosis of paroxysmal atrial fibrillation.  Prior history of multiple lacunar infarcts and mild cognitive impairment.  Vascular risk factors of age, hypertension, hyperlipidemia atrial fibrillation     PLAN: I had a long d/w patient and her son in law about her recent TIA, prior lacunar strokes , recent diagnosis of atrial fibrillation, risk for recurrent stroke/TIAs, personally independently reviewed imaging studies and stroke evaluation results and answered questions.Continue Eliquis (apixaban) daily  for secondary stroke prevention and maintain strict control of hypertension with blood pressure goal below 130/90, diabetes with hemoglobin A1c goal below 6.5% and lipids with LDL cholesterol goal below 70 mg/dL. I also advised the patient us her walker at all times and discussed fall safety precautions. Continue Aricept 10 mg daily for her mild cognitive impairment. She will be moving to [place] soon and will f/u with her primary physician there .She needs close monitoring of her renal function to adjust eliquis dose.. Greater than 50% time during this 45-minute consultation visit was spent on counseling and coordination of care about her stroke, TIA, cognitive impairment and discussion about fall risk and answering questions followup in the future with me in future only as needed. Delia HeadyPramod Coreena Rubalcava, MD  [facility] [address] [place], [place] [address]  Phone [phone] Fax [phone] Note: This document was prepared with digital dictation and possible smart phrase technology. Any transcriptional errors that result from this process are unintentional.  ADDENDUM :  The patient has several high risk features follow-up bleeding due to her advanced age, baseline cognitive impairment, gait balance difficulties as well as MRI appearance of extensive white matter changes and 6-10 microhemorrhages.  Her chads 2 Vascor of 6 gives her a 9 %/year risk of stroke and has  bled score gives her probably is similar risk for bleeding.  After careful discussion with patient's cardiologist Dr. Graciela HusbandsKlein, neurologist Dr. Roda ShuttersXu vascular neurologist well as her primary physician Dr. Mechele ClaudeGreg Filippo in [place] I think the risk benefit of anticoagulation versus bleeding is still slightly in favor of anticoagulation hence continue Eliquis 5 mg twice daily but with close monitoring of hematocrit and renal function at least every  6 months.  Low threshold of stopping Eliquis and switching to antiplatelet therapy in case of significant anemia, bleeding or worsening renal function with creatinine close to 1.5 Discussed with patient's daughter Dr. Denton Lank who is in agreement with above plan. Antony Contras, MD 10/22/2018 1025 am [facility] [address] [place], [place] [address]  Phone [phone] Fax [phone]

## 2018-10-23 ENCOUNTER — Encounter: Payer: Self-pay | Admitting: Emergency Medicine

## 2018-10-23 NOTE — Progress Notes (Unsigned)
I called and spoke with Megan Livingston's caregiver. I told him I would be happy to talk to her primary care provider in [place]. She is currently asymptomatic regarding her treated urinary tract infection. She will need to have follow up urine testing on return to [place].

## 2018-10-28 ENCOUNTER — Encounter (HOSPITAL_COMMUNITY): Payer: Self-pay | Admitting: *Deleted

## 2018-10-31 ENCOUNTER — Telehealth: Payer: Self-pay | Admitting: Emergency Medicine

## 2018-10-31 LAB — SARS-COV-2 RNA,(COVID-19) QUALITATIVE NAAT: SARS CoV2 RNA: NOT DETECTED

## 2018-10-31 NOTE — Telephone Encounter (Signed)
LMTRC

## 2018-11-01 NOTE — Telephone Encounter (Signed)
Notified patient's son in law of negative COVID results.

## 2018-11-09 ENCOUNTER — Encounter (HOSPITAL_COMMUNITY): Payer: Self-pay | Admitting: Physician Assistant

## 2018-11-09 ENCOUNTER — Other Ambulatory Visit: Payer: Self-pay

## 2018-11-09 ENCOUNTER — Ambulatory Visit (HOSPITAL_COMMUNITY)
Admission: RE | Admit: 2018-11-09 | Discharge: 2018-11-09 | Disposition: A | Discharge status: Home / Self Care | Payer: Medicare Other | Source: Ambulatory Visit | Attending: Physician Assistant | Admitting: Physician Assistant

## 2018-11-09 VITALS — BP 122/74 | HR 88 | Ht 64.0 in | Wt 196.8 lb

## 2018-11-09 DIAGNOSIS — I480 Paroxysmal atrial fibrillation: Secondary | ICD-10-CM | POA: Diagnosis present

## 2018-11-09 DIAGNOSIS — Z79899 Other long term (current) drug therapy: Secondary | ICD-10-CM | POA: Insufficient documentation

## 2018-11-09 DIAGNOSIS — Z823 Family history of stroke: Secondary | ICD-10-CM | POA: Diagnosis not present

## 2018-11-09 DIAGNOSIS — Z8673 Personal history of transient ischemic attack (TIA), and cerebral infarction without residual deficits: Secondary | ICD-10-CM | POA: Insufficient documentation

## 2018-11-09 DIAGNOSIS — Z8249 Family history of ischemic heart disease and other diseases of the circulatory system: Secondary | ICD-10-CM | POA: Insufficient documentation

## 2018-11-09 DIAGNOSIS — I10 Essential (primary) hypertension: Secondary | ICD-10-CM | POA: Diagnosis not present

## 2018-11-09 DIAGNOSIS — Z7901 Long term (current) use of anticoagulants: Secondary | ICD-10-CM | POA: Diagnosis not present

## 2018-11-09 LAB — BASIC METABOLIC PANEL
Anion gap: 8 (ref 5–15)
BUN: 18 mg/dL (ref 8–23)
CO2: 26 mmol/L (ref 22–32)
Calcium: 9 mg/dL (ref 8.9–10.3)
Chloride: 105 mmol/L (ref 98–111)
Creatinine, Ser: 1.11 mg/dL — ABNORMAL HIGH (ref 0.44–1.00)
GFR calc Af Amer: 52 mL/min — ABNORMAL LOW (ref >60)
GFR calc non Af Amer: 45 mL/min — ABNORMAL LOW (ref >60)
Glucose, Bld: 128 mg/dL — ABNORMAL HIGH (ref 70–99)
Potassium: 4.7 mmol/L (ref 3.5–5.1)
Sodium: 139 mmol/L (ref 135–145)

## 2018-11-09 LAB — CBC
HCT: 43.3 % (ref 36.0–46.0)
Hemoglobin: 13.5 g/dL (ref 12.0–15.0)
MCH: 29.3 pg (ref 26.0–34.0)
MCHC: 31.2 g/dL (ref 30.0–36.0)
MCV: 94.1 fL (ref 80.0–100.0)
Platelets: 242 10*3/uL (ref 150–400)
RBC: 4.6 MIL/uL (ref 3.87–5.11)
RDW: 13.5 % (ref 11.5–15.5)
WBC: 8.7 10*3/uL (ref 4.0–10.5)
nRBC: 0 % (ref 0.0–0.2)

## 2018-11-09 NOTE — Progress Notes (Signed)
Primary Care Physician: System, Pcp Not In Primary Electrophysiologist: Dr Graciela HusbandsKlein Referring Physician: Dr Melina CopaKlein   Megan Livingston is a 85 y.o. female with a history of depression, CVA, HTN, and paroxysmal atrial fibrillation who presents for follow up in the Afib Clinic. Patient was hospitalized with acute TIA. She was noted to have frequent atrial ectopy and was given a 30 day monitor to screen for afib. On 10/19/18, she was noted to have afib HR 100s around dinner time which then converted back to SR. There were no triggering factors that the patient could identify. She had been on DAPT for 3 weeks and Plavix only since 10/16/18. She was asymptomatic during the episode. Her Plavix was changed to Eliquis.   On follow up today, patient reports that she has done well with no heart racing or palpitations. She is tolerating the Eliquis well with no bleeding issues. [place] tomorrow.   Today, she denies symptoms of palpitations, chest pain, shortness of breath, orthopnea, PND, lower extremity edema, presyncope, syncope, snoring, daytime somnolence, bleeding, or neurologic sequela. The patient is tolerating medications without difficulties and is otherwise without complaint today.    Atrial Fibrillation Risk Factors:  she does not have symptoms or diagnosis of sleep apnea. The patient does have a history of early familial atrial fibrillation or other arrhythmias.  she has a BMI of Body mass index is 33.78 kg/m.Marland Kitchen. Filed Weights   11/09/18 1351  Weight: 89.3 kg    Family History  Problem Relation Age of Onset  . Hypertension Mother   . Stroke Mother   . Hypertension Father   . Stroke Brother      Atrial Fibrillation Management history:  Previous antiarrhythmic drugs: none Previous cardioversions: none Previous ablations: none CHADS2VASC score: 6 Anticoagulation history: Eliquis   Past Medical History:  Diagnosis Date  . Atrial fibrillation (HCC)   .  Depression   . Hypertension   . Stroke [facility](HCC)    Past Surgical History:  Procedure Laterality Date  . BACK SURGERY    . CHOLECYSTECTOMY    . JOINT REPLACEMENT      Current Outpatient Medications  Medication Sig Dispense Refill  . apixaban (ELIQUIS) 5 MG TABS tablet Take 1 tablet (5 mg total) by mouth 2 (two) times daily. 60 tablet 1  . atorvastatin (LIPITOR) 40 MG tablet Take 1 tablet (40 mg total) by mouth daily. 30 tablet 11  . cholecalciferol (VITAMIN D3) 25 MCG (1000 UT) tablet Take 1,000 Units by mouth daily.    Marland Kitchen. docusate sodium (COLACE) 100 MG capsule Take 100 mg by mouth as needed for mild constipation.    Marland Kitchen. donepezil (ARICEPT) 10 MG tablet Take 10 mg by mouth at bedtime.    . DULoxetine (CYMBALTA) 60 MG capsule Take 60 mg by mouth daily.     Marland Kitchen. losartan (COZAAR) 100 MG tablet Take 100 mg by mouth daily.    . metoprolol tartrate (LOPRESSOR) 25 MG tablet Take 12.5 mg by mouth 2 (two) times daily.     Marland Kitchen. omeprazole (PRILOSEC) 40 MG capsule Take 40 mg by mouth daily.     Marland Kitchen. tolterodine (DETROL LA) 4 MG 24 hr capsule Take 4 mg by mouth daily.    Marland Kitchen. triamterene-hydrochlorothiazide (MAXZIDE-25) 37.5-25 MG tablet Take 1 tablet by mouth daily.    Marland Kitchen. acetaminophen (TYLENOL) 325 MG tablet Take 650 mg by mouth every 6 (six) hours as needed for mild pain or headache.    . mupirocin  cream (BACTROBAN) 2 % Apply 1 application topically 3 (three) times daily. Apply to entrance to right ear canal 3 times a day.  May substitute ointment. 15 g 0   No current facility-administered medications for this encounter.     Allergies  Allergen Reactions  . Oxycodone Other (See Comments)    hypotension    Social History   Socioeconomic History  . Marital status: Single    Spouse name: Not on file  . Number of children: Not on file  . Years of education: Not on file  . Highest education level: Not on file  Occupational History  . Not on file  Social Needs  . Financial resource strain: Not on file   . Food insecurity    Worry: Not on file    Inability: Not on file  . Transportation needs    Medical: Not on file    Non-medical: Not on file  Tobacco Use  . Smoking status: Never Smoker  . Smokeless tobacco: Never Used  Substance and Sexual Activity  . Alcohol use: No  . Drug use: Not Currently  . Sexual activity: Not on file  Lifestyle  . Physical activity    Days per week: Not on file    Minutes per session: Not on file  . Stress: Not on file  Relationships  . Social Herbalist on phone: Not on file    Gets together: Not on file    Attends religious service: Not on file    Active member of club or organization: Not on file    Attends meetings of clubs or organizations: Not on file    Relationship status: Not on file  . Intimate partner violence    Fear of current or ex partner: Not on file    Emotionally abused: Not on file    Physically abused: Not on file    Forced sexual activity: Not on file  Other Topics Concern  . Not on file  Social History Narrative  . Not on file     ROS- All systems are reviewed and negative except as per the HPI above.  Physical Exam: Vitals:   11/09/18 1351  BP: 122/74  Pulse: 88  Weight: 89.3 kg  Height: 5\' 4"  (1.626 m)    GEN- The patient is well appearing obese elederly female, alert and oriented x 3 today.   Head- normocephalic, atraumatic Eyes-  Sclera clear, conjunctiva pink Ears- hearing intact Oropharynx- clear Neck- supple  Lungs- Clear to ausculation bilaterally, normal work of breathing Heart- Regular rate and rhythm, no murmurs, rubs or gallops  GI- soft, NT, ND, + BS Extremities- no clubbing, cyanosis, or edema MS- no significant deformity or atrophy Skin- no rash or lesion Psych- euthymic mood, full affect Neuro- strength and sensation are intact  Wt Readings from Last 3 Encounters:  11/09/18 89.3 kg  10/21/18 88.6 kg  09/23/18 92.6 kg    EKG today demonstrates SR HR 88, PAC, PR 156, QRS 68,  QTc 442  Echo 09/24/18 demonstrated  1. The left ventricle has hyperdynamic systolic function, with an ejection fraction of >65%. The cavity size was normal. Left ventricular diastolic Doppler parameters are consistent with impaired relaxation. No evidence of left ventricular regional wall  motion abnormalities.  2. The right ventricle has normal systolic function. The cavity was normal. There is no increase in right ventricular wall thickness.  3. The mitral valve is abnormal. Mild thickening of the mitral valve leaflet. Mild  calcification of the mitral valve leaflet.  4. The tricuspid valve is grossly normal.  5. The aortic valve is tricuspid. Mild calcification of the aortic valve. No stenosis of the aortic valve.  SUMMARY   LVEF >65%, normal wall thickness, normal wall motion, grade 1 DD, normal LV filling pressure, normal LA size, negative for PFO by color doppler   Epic records are reviewed at length today  Assessment and Plan:  1. Paroxysmal atrial fibrillation Patient appears to be maintaining SR. Continue Eliquis 5 mg BID Bmet/CBC today. Continue Lopressor 12.5 mg BID  This patients CHA2DS2-VASc Score and unadjusted Ischemic Stroke Rate (% per year) is equal to 9.7 % stroke rate/year from a score of 6  Above score calculated as 1 point each if present [CHF, HTN, DM, Vascular=MI/PAD/Aortic Plaque, Age if 65-74, or Female] Above score calculated as 2 points each if present [Age > 75, or Stroke/TIA/TE]   2. HTN Stable, no changes today.   Follow up with AF clinic as needed. Encouraged her to establish with cardiology in [place]. Son reports she has enough medicine to last until she gets established.    Jorja Loaicky  PA-C Afib Clinic [facility] [address] [place], [place] [address] [phone] 11/09/2018 2:10 PM

## 2018-11-09 NOTE — Addendum Note (Signed)
Encounter addended by: Juluis Mire, RN on: 11/09/2018 2:23 PM  Actions taken: Order list changed

## 2018-11-10 ENCOUNTER — Other Ambulatory Visit: Payer: Self-pay | Admitting: Medical

## 2018-11-10 DIAGNOSIS — R42 Dizziness and giddiness: Secondary | ICD-10-CM

## 2018-11-10 DIAGNOSIS — G459 Transient cerebral ischemic attack, unspecified: Secondary | ICD-10-CM

## 2018-11-10 DIAGNOSIS — I4891 Unspecified atrial fibrillation: Secondary | ICD-10-CM

## 2020-07-10 IMAGING — MR MRI HEAD WITHOUT CONTRAST
12 of 17 series · 26 of 48 positions shown · IV contrast (gadavist)
Comparison: Head CT 09/23/2018

CLINICAL DATA: Right arm weakness

EXAM:
MR HEAD WITHOUT CONTRAST
MR CIRCLE OF WILLIS WITHOUT CONTRAST
MRA OF THE NECK WITHOUT AND WITH CONTRAST
TECHNIQUE: Multiplanar, multiecho pulse sequences of the brain, circle of
willis and surrounding structures were obtained without intravenous
contrast. Angiographic images of the neck were obtained using MRA
technique without and with intravenous contrast.
CONTRAST:  8 mL Gadavist

[Series 2: DWI · axial · 3.0mm · 0.94mm/px · z∈[-42,+104]mm · 4 of 100 slices shown (1 of 2)]
[im 1/100]
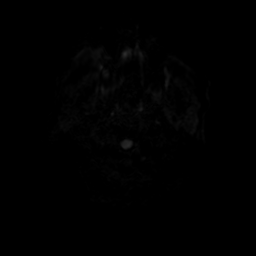
[im 34/100]
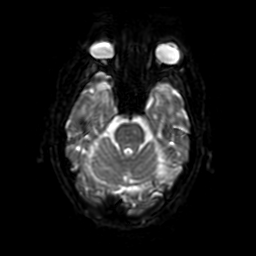
[im 67/100]
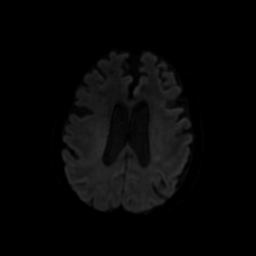
[im 100/100]
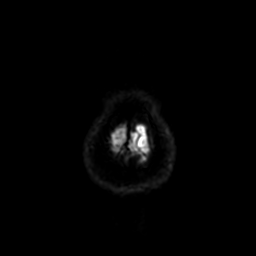

[Series 3: DWI · coronal · 4.0mm · 0.94mm/px · 3 of 72 slices shown (2 of 2)]
[im 1/72]
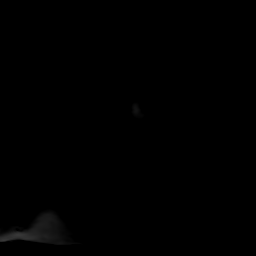
[im 36/72]
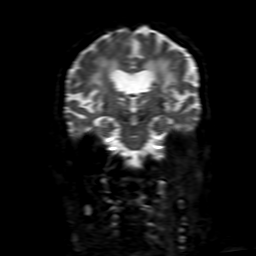
[im 72/72]
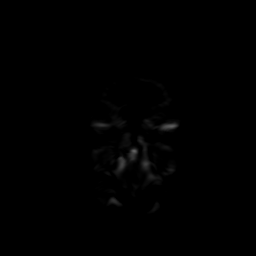

[Series 4: FLAIR · sagittal · 5.0mm · 0.47mm/px · 1 of 23 slices shown (1 of 2)]
[im 1/23]
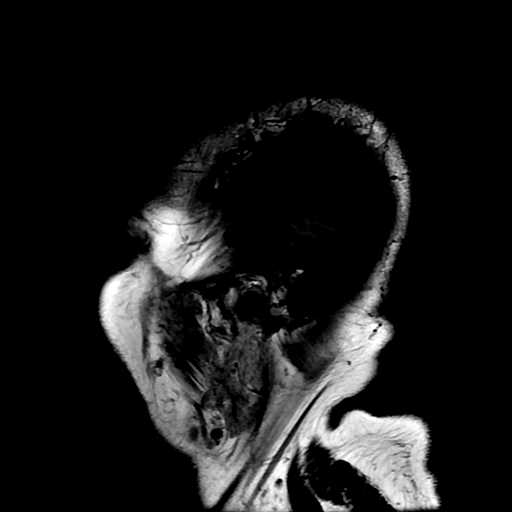

[Series 5: T2 · axial · 5.0mm · 0.47mm/px · 1 of 25 slices shown (1 of 2)]
[im 1/25]
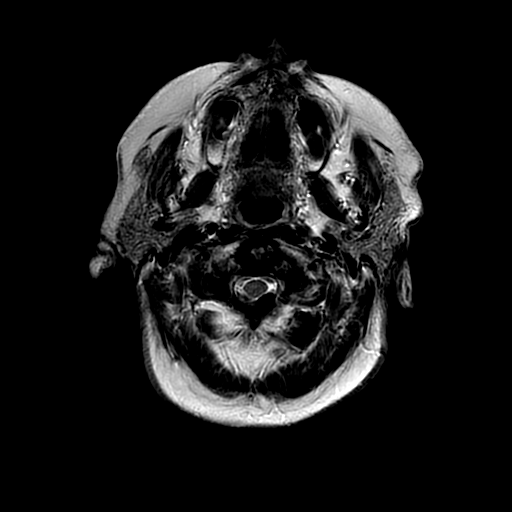

[Series 6: FLAIR · axial · 5.0mm · 0.47mm/px · 1 of 25 slices shown (2 of 2)]
[im 1/25]
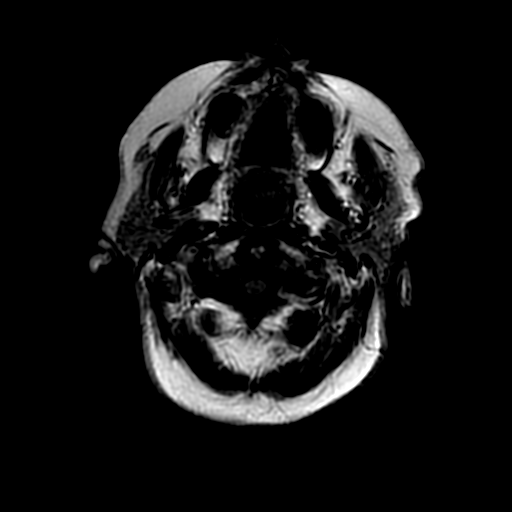

[Series 7: ax (id) · axial · 1.0mm · 0.43mm/px · z∈[-33,+20]mm · 4 of 184 slices shown]
[im 1/184]
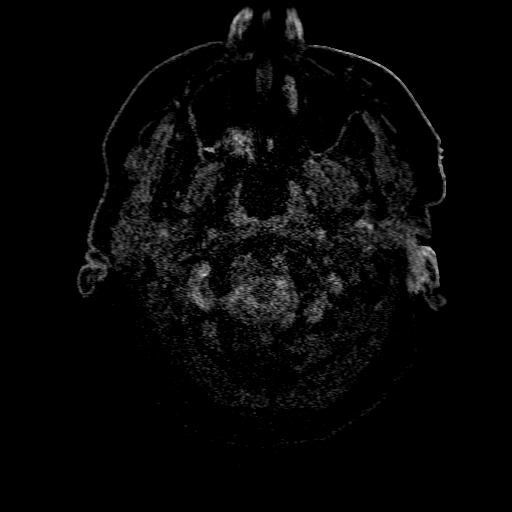
[im 37/184]
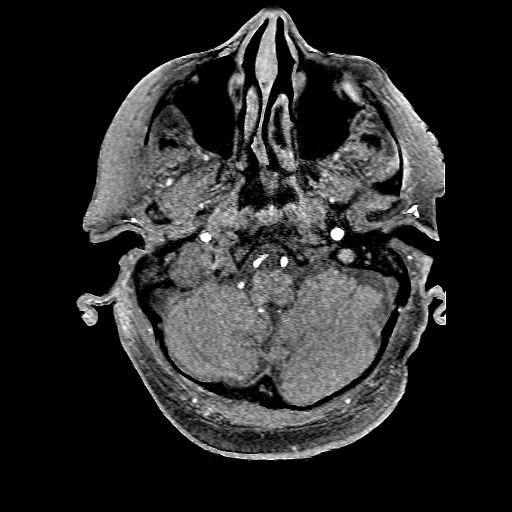
[im 74/184]
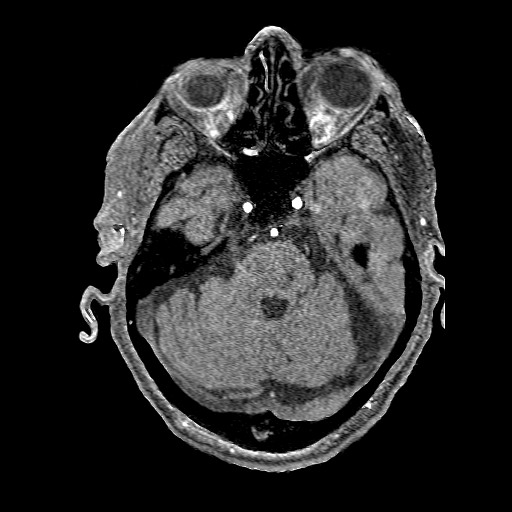
[im 110/184]
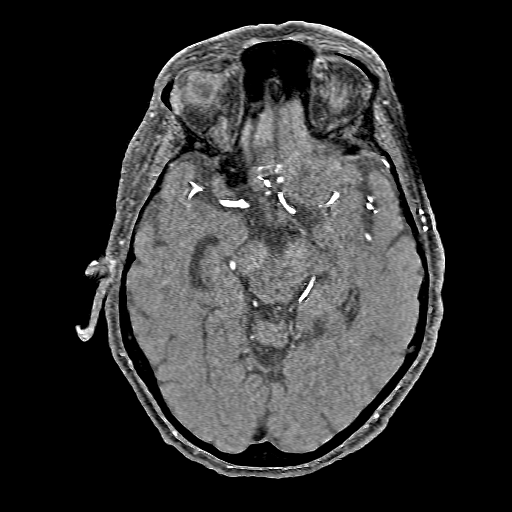

[Series 8: SWI · axial · 3.0mm · 0.47mm/px · z∈[-42,+105]mm · 3 of 100 slices shown (1 of 2)]
[im 1/100]
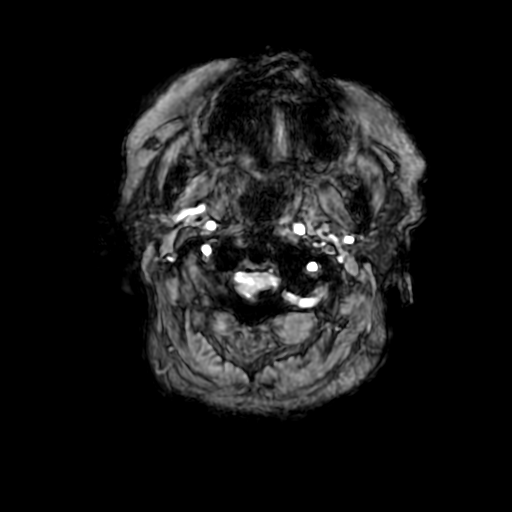
[im 50/100]
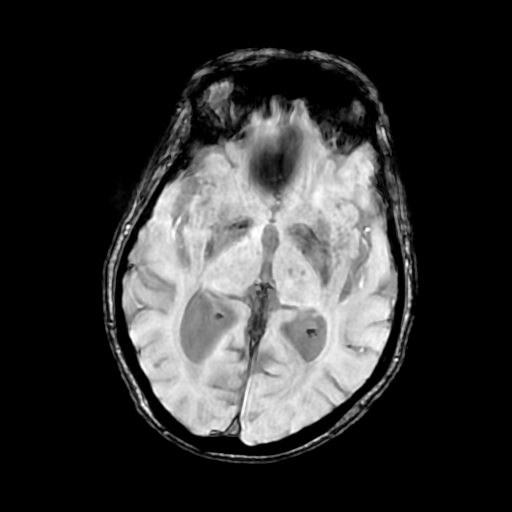
[im 100/100]
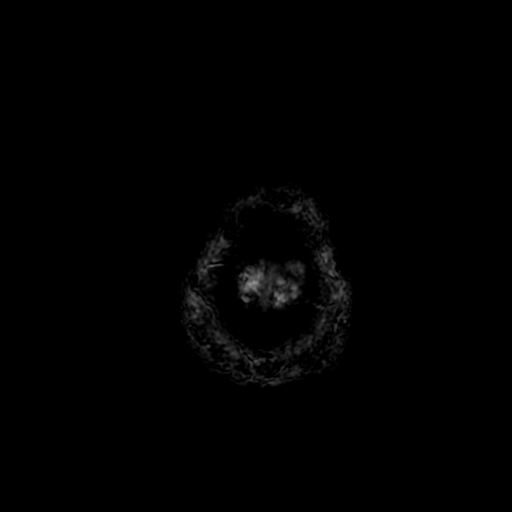

[Series 11: T1 · axial · non-contrast · 3.0mm · 0.94mm/px · z∈[-39,+101]mm · 2 of 48 slices shown]
[im 1/48]
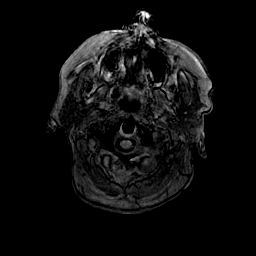
[im 48/48]
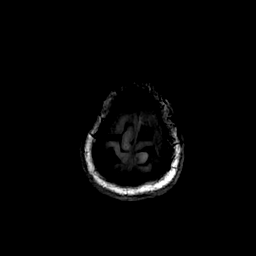

[Series 13: T2 · coronal · 5.0mm · 0.47mm/px · 1 of 30 slices shown (2 of 2)]
[im 1/30]
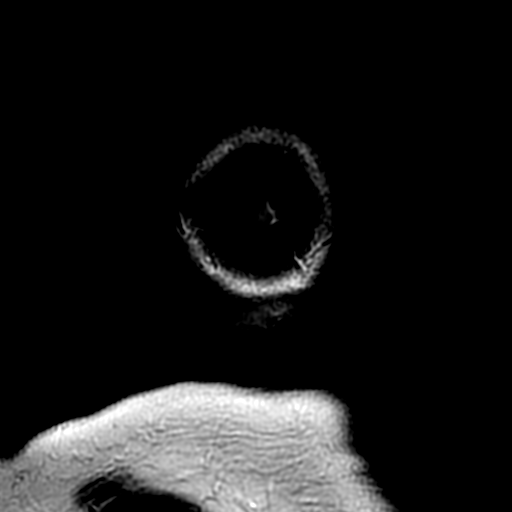

[Series 250: ADC · axial · 3.0mm · 0.94mm/px · z∈[-42,+104]mm · 2 of 50 slices shown (1 of 2)]
[im 1/50]
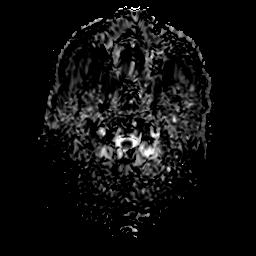
[im 50/50]
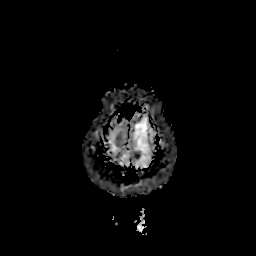

[Series 350: ADC · coronal · 4.0mm · 0.94mm/px · 1 of 36 slices shown (2 of 2)]
[im 1/36]
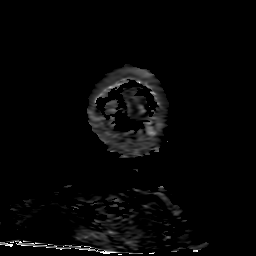

[Series 800: SWI · axial · 3.0mm · 0.47mm/px · z∈[-42,+105]mm · 3 of 100 slices shown (2 of 2)]
[im 1/100]
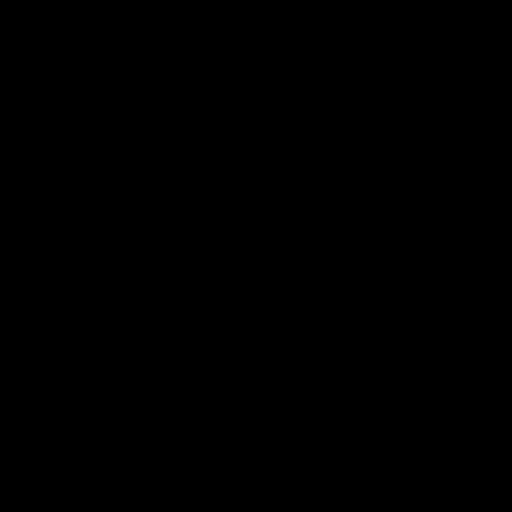
[im 50/100]
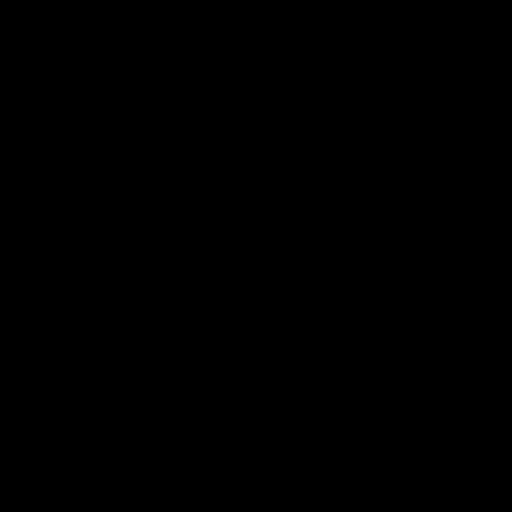
[im 100/100]
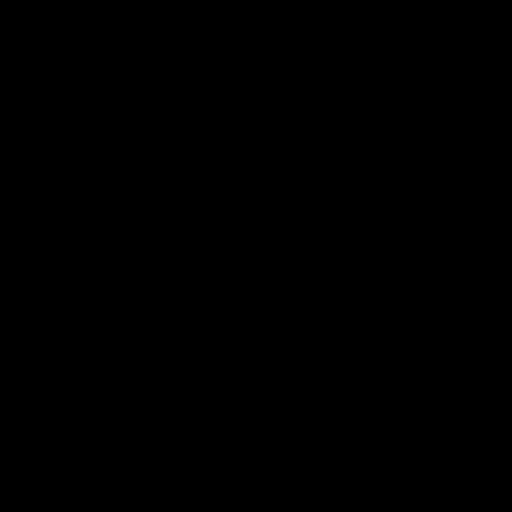

[26 of 48 positions shown; findings below may reference images not displayed]

FINDINGS: MRI HEAD FINDINGS

BRAIN: There is no acute infarct, acute hemorrhage or extra-axial
collection. The midline structures are normal. There is no midline
shift or mass effect. Diffuse confluent hyperintense T2-weighted
signal within the periventricular, deep and juxtacortical white
matter, most commonly due to chronic ischemic microangiopathy. There
are multiple old small vessel infarcts of the basal ganglia and
cerebellum. There is generalized atrophy without lobar predilection.
There are 5-10 scattered microhemorrhages in a nonspecific pattern.

VASCULAR: The major intracranial arterial and venous sinus flow
voids are normal.

SKULL AND UPPER CERVICAL SPINE: Calvarial bone marrow signal is
normal. There is no skull base mass. The visualized upper cervical
spine and soft tissues are normal.

SINUSES/ORBITS: There are no fluid levels or advanced mucosal
thickening. The mastoid air cells and middle ear cavities are free
of fluid. The orbits are normal.

MRA HEAD FINDINGS

POSTERIOR CIRCULATION:

--Vertebral arteries: Normal V4 segments.

--Posterior inferior cerebellar arteries (PICA): Patent origins from
the vertebral arteries.

--Anterior inferior cerebellar arteries (AICA): Patent origins from
the basilar artery.

--Basilar artery: Normal.

--Superior cerebellar arteries: Normal.

--Posterior cerebral arteries (PCA): Severe stenosis at the left
P1-2 junction. Both PCAs originate from the basilar artery.
Posterior communicating arteries (p-comm) are diminutive or absent.

ANTERIOR CIRCULATION:

--Intracranial internal carotid arteries: Normal.

--Anterior cerebral arteries (ACA): Normal. Both A1 segments are
present. Patent anterior communicating artery (a-comm).

--Middle cerebral arteries (MCA): Normal.

MRA NECK FINDINGS

Aortic arch: Normal 3 vessel aortic branching pattern. The
visualized subclavian arteries are normal.

Right carotid system: There is atherosclerotic plaque the right
carotid bifurcation with qualitative narrowing of the proximal right
internal carotid artery that does not translate 2 hemodynamically
significant stenosis by NASCET criteria.

Left carotid system: Normal course and caliber without stenosis or
evidence of dissection.

Vertebral arteries: Left dominant. Vertebral artery origins are
normal. Vertebral arteries are normal in course and caliber to the
vertebrobasilar confluence without stenosis or evidence of
dissection.
IMPRESSION: 1. No acute intracranial abnormality.
2. Chronic small vessel ischemia, atrophy and old small vessel
infarcts.
3. No emergent large vessel occlusion or high-grade stenosis.
Moderate-to-severe stenosis of the left PCA P1-2 junction.
4. Atherosclerosis at the right carotid bifurcation without
hemodynamically significant stenosis.
